# Patient Record
Sex: Male | Born: 1960
Health system: Southern US, Community
[De-identification: ages and names within clinical notes are randomized; demographics above are authoritative.]

## PROBLEM LIST (undated history)

## (undated) DIAGNOSIS — I471 Supraventricular tachycardia, unspecified: Secondary | ICD-10-CM

## (undated) DIAGNOSIS — I4892 Unspecified atrial flutter: Secondary | ICD-10-CM

## (undated) DIAGNOSIS — E669 Obesity, unspecified: Secondary | ICD-10-CM

## (undated) DIAGNOSIS — K219 Gastro-esophageal reflux disease without esophagitis: Secondary | ICD-10-CM

## (undated) HISTORY — PX: OTHER SURGICAL HISTORY: SHX169

## (undated) HISTORY — DX: Obesity, unspecified: E66.9

## (undated) HISTORY — DX: Unspecified atrial flutter: I48.92

## (undated) HISTORY — DX: Supraventricular tachycardia: I47.1

## (undated) HISTORY — DX: Gastro-esophageal reflux disease without esophagitis: K21.9

## (undated) HISTORY — DX: Supraventricular tachycardia, unspecified: I47.10

---

## 1999-06-28 ENCOUNTER — Emergency Department (HOSPITAL_COMMUNITY): Admission: EM | Admit: 1999-06-28 | Discharge: 1999-06-28 | Payer: Self-pay | Admitting: Emergency Medicine

## 2002-07-12 ENCOUNTER — Ambulatory Visit (HOSPITAL_COMMUNITY): Admission: RE | Admit: 2002-07-12 | Discharge: 2002-07-12 | Payer: Self-pay | Admitting: Sports Medicine

## 2002-07-12 ENCOUNTER — Encounter: Payer: Self-pay | Admitting: Sports Medicine

## 2002-07-13 ENCOUNTER — Ambulatory Visit (HOSPITAL_BASED_OUTPATIENT_CLINIC_OR_DEPARTMENT_OTHER): Admission: RE | Admit: 2002-07-13 | Discharge: 2002-07-13 | Payer: Self-pay | Admitting: *Deleted

## 2005-10-11 ENCOUNTER — Ambulatory Visit (HOSPITAL_COMMUNITY): Admission: RE | Admit: 2005-10-11 | Discharge: 2005-10-11 | Payer: Self-pay | Admitting: Orthopedic Surgery

## 2010-05-06 HISTORY — PX: OTHER SURGICAL HISTORY: SHX169

## 2011-01-16 ENCOUNTER — Inpatient Hospital Stay (HOSPITAL_COMMUNITY)
Admission: EM | Admit: 2011-01-16 | Discharge: 2011-01-17 | DRG: 121 | Disposition: A | Discharge status: Home / Self Care | Payer: BC Managed Care – PPO | Attending: Cardiology | Admitting: Cardiology

## 2011-01-16 ENCOUNTER — Emergency Department (HOSPITAL_COMMUNITY): Payer: BC Managed Care – PPO

## 2011-01-16 DIAGNOSIS — I4891 Unspecified atrial fibrillation: Secondary | ICD-10-CM | POA: Diagnosis present

## 2011-01-16 DIAGNOSIS — E669 Obesity, unspecified: Secondary | ICD-10-CM | POA: Diagnosis present

## 2011-01-16 DIAGNOSIS — I214 Non-ST elevation (NSTEMI) myocardial infarction: Secondary | ICD-10-CM | POA: Diagnosis present

## 2011-01-16 DIAGNOSIS — K219 Gastro-esophageal reflux disease without esophagitis: Secondary | ICD-10-CM | POA: Diagnosis present

## 2011-01-16 DIAGNOSIS — I498 Other specified cardiac arrhythmias: Principal | ICD-10-CM | POA: Diagnosis present

## 2011-01-16 DIAGNOSIS — I248 Other forms of acute ischemic heart disease: Secondary | ICD-10-CM | POA: Diagnosis present

## 2011-01-16 DIAGNOSIS — Z7982 Long term (current) use of aspirin: Secondary | ICD-10-CM

## 2011-01-16 DIAGNOSIS — E785 Hyperlipidemia, unspecified: Secondary | ICD-10-CM | POA: Diagnosis present

## 2011-01-16 DIAGNOSIS — I2489 Other forms of acute ischemic heart disease: Secondary | ICD-10-CM | POA: Diagnosis present

## 2011-01-16 DIAGNOSIS — R0602 Shortness of breath: Secondary | ICD-10-CM

## 2011-01-16 LAB — MAGNESIUM: Magnesium: 1.9 mg/dL (ref 1.5–2.5)

## 2011-01-16 LAB — BASIC METABOLIC PANEL
BUN: 31 mg/dL — ABNORMAL HIGH (ref 6–23)
CO2: 25 mEq/L (ref 19–32)
Calcium: 9 mg/dL (ref 8.4–10.5)
Chloride: 108 mEq/L (ref 96–112)
Creatinine, Ser: 1.19 mg/dL (ref 0.50–1.35)
GFR calc Af Amer: >60 mL/min (ref >60)
GFR calc non Af Amer: >60 mL/min (ref >60)
Glucose, Bld: 68 mg/dL — ABNORMAL LOW (ref 70–99)
Potassium: 3.7 mEq/L (ref 3.5–5.1)
Sodium: 143 mEq/L (ref 135–145)

## 2011-01-16 LAB — DIFFERENTIAL
Basophils Absolute: 0 10*3/uL (ref 0.0–0.1)
Basophils Relative: 0 % (ref 0–1)
Eosinophils Absolute: 0.1 10*3/uL (ref 0.0–0.7)
Eosinophils Relative: 1 % (ref 0–5)
Lymphocytes Relative: 35 % (ref 12–46)
Lymphs Abs: 3.2 10*3/uL (ref 0.7–4.0)
Monocytes Absolute: 0.8 10*3/uL (ref 0.1–1.0)
Monocytes Relative: 9 % (ref 3–12)
Neutro Abs: 4.9 10*3/uL (ref 1.7–7.7)
Neutrophils Relative %: 55 % (ref 43–77)

## 2011-01-16 LAB — CBC
HCT: 38.9 % — ABNORMAL LOW (ref 39.0–52.0)
Hemoglobin: 14.2 g/dL (ref 13.0–17.0)
MCH: 30.4 pg (ref 26.0–34.0)
MCHC: 36.5 g/dL — ABNORMAL HIGH (ref 30.0–36.0)
MCV: 83.3 fL (ref 78.0–100.0)
Platelets: 199 10*3/uL (ref 150–400)
RBC: 4.67 MIL/uL (ref 4.22–5.81)
RDW: 12 % (ref 11.5–15.5)
WBC: 9 10*3/uL (ref 4.0–10.5)

## 2011-01-16 LAB — POCT I-STAT TROPONIN I: Troponin i, poc: 0.14 ng/mL (ref 0.00–0.08)

## 2011-01-17 DIAGNOSIS — I471 Supraventricular tachycardia: Secondary | ICD-10-CM

## 2011-01-17 DIAGNOSIS — I517 Cardiomegaly: Secondary | ICD-10-CM

## 2011-01-17 LAB — COMPREHENSIVE METABOLIC PANEL
ALT: 33 U/L (ref 0–53)
AST: 24 U/L (ref 0–37)
Albumin: 3.8 g/dL (ref 3.5–5.2)
Alkaline Phosphatase: 81 U/L (ref 39–117)
BUN: 24 mg/dL — ABNORMAL HIGH (ref 6–23)
CO2: 25 mEq/L (ref 19–32)
Calcium: 8.7 mg/dL (ref 8.4–10.5)
Chloride: 107 mEq/L (ref 96–112)
Creatinine, Ser: 0.73 mg/dL (ref 0.50–1.35)
GFR calc Af Amer: >60 mL/min (ref >60)
GFR calc non Af Amer: >60 mL/min (ref >60)
Glucose, Bld: 90 mg/dL (ref 70–99)
Potassium: 3.6 mEq/L (ref 3.5–5.1)
Sodium: 140 mEq/L (ref 135–145)
Total Bilirubin: 0.4 mg/dL (ref 0.3–1.2)
Total Protein: 6.4 g/dL (ref 6.0–8.3)

## 2011-01-17 LAB — CK TOTAL AND CKMB (NOT AT ARMC)
Relative Index: 1.4 (ref 0.0–2.5)
Total CK: 518 U/L — ABNORMAL HIGH (ref 7–232)

## 2011-01-17 LAB — CBC
HCT: 39.1 % (ref 39.0–52.0)
Hemoglobin: 14 g/dL (ref 13.0–17.0)
MCH: 29.9 pg (ref 26.0–34.0)
MCHC: 35.8 g/dL (ref 30.0–36.0)
MCV: 83.4 fL (ref 78.0–100.0)
Platelets: 195 10*3/uL (ref 150–400)
RBC: 4.69 MIL/uL (ref 4.22–5.81)
RDW: 12.2 % (ref 11.5–15.5)
WBC: 7.4 10*3/uL (ref 4.0–10.5)

## 2011-01-17 LAB — GLUCOSE, CAPILLARY: Glucose-Capillary: 96 mg/dL (ref 70–99)

## 2011-01-17 LAB — HEMOGLOBIN A1C: Mean Plasma Glucose: 108 mg/dL (ref <117)

## 2011-01-17 LAB — LIPID PANEL
Cholesterol: 190 mg/dL (ref 0–200)
HDL: 47 mg/dL (ref >39)
LDL Cholesterol: 117 mg/dL — ABNORMAL HIGH (ref 0–99)
Total CHOL/HDL Ratio: 4 RATIO
Triglycerides: 130 mg/dL (ref <150)
VLDL: 26 mg/dL (ref 0–40)

## 2011-01-17 LAB — CK: Total CK: 524 U/L — ABNORMAL HIGH (ref 7–232)

## 2011-01-17 LAB — PROTIME-INR
INR: 0.94 (ref 0.00–1.49)
Prothrombin Time: 12.8 seconds (ref 11.6–15.2)

## 2011-01-17 LAB — TROPONIN I: Troponin I: 0.52 ng/mL (ref <0.30)

## 2011-01-17 LAB — HEPARIN LEVEL (UNFRACTIONATED): Heparin Unfractionated: 0.28 IU/mL — ABNORMAL LOW (ref 0.30–0.70)

## 2011-01-19 NOTE — H&P (Signed)
NAMEDEVAL, MROCZKA NO.:  [id number]  MEDICAL RECORD NO.:  [id number]  LOCATION:  [facility]                         FACILITY:  [facility]  PHYSICIAN:  Lenon Oms, MD  DATE OF BIRTH:  Dec 31, 1960  DATE OF ADMISSION:  01/16/2011 DATE OF DISCHARGE:                             HISTORY & PHYSICAL   CARDIOLOGIST:  None.  PRIMARY CARE PHYSICIAN:  Doran Nestle. Ehinger, MD.  CHIEF COMPLAINT:  Shortness of breath.  HISTORY OF PRESENT ILLNESS:  [name] is a 50 year old Huynh with no history for MI or known coronary disease who presented to the emergency department here at [facility] with chief complaint of shortness of breath.  The patient stated he was at work this afternoon where he had sudden onset of shortness of breath and around 4:30 p.m., the patient states that his heart was racing along with dizziness, diaphoresis, and chest heaviness.  He rated his chest heaviness as 5/10 at that time.  The patient's symptoms did not go away with rest, so the patient went to see a friend who works at the [employer].  There, he was noted to be tachycardic and so EMS was called.  The patient was attempted to break his tachycardia with adenosine prior to arrival here at the emergency department.  He was given a total of 24 mg of adenosine, however, had no response.  In addition, had no response to vagal maneuvers.  The patient in emergency room here remained tachycardic and what appeared to be SVT.  Heart rate is around 150s-200s.  The patient was again given another dose of adenosine 6 mg with no response.  Given his symptomatic tachycardia, the patient was cardioverted at 100 joules.  The patient did have successful response and was in normal sinus rhythm status post cardioversion. At this time, the patient stated his symptoms have improved and is resting comfortably.  PAST MEDICAL HISTORY:  The patient denied any history of arrhythmia. The patient is not  currently treated for hypertension, diabetes, high cholesterol.  Denies any prior cardiac catheterization, stress test.  ALLERGIES:  No known drug allergies.  MEDICATIONS:  The patient takes occasional ibuprofen and aspirin.  PAST SURGICAL HISTORY:  The patient had right toe surgery 2weeks prior.  SOCIAL HISTORY:  The patient lives in [place] with his girlfriend and her son.  Occupation, the patient works in Therapist, music care.  He denies any tobacco use.  He drinks approximately 1-2 beers a day.  FAMILY HISTORY:  Mother had a brain aneurysm.  Father has congestive heart failure and diabetes.  REVIEW OF SYSTEMS:  The patient has had GERD in the past, which he is taking Prilosec; however, this has been over a year.  Denied any recent similar episodes or lower chest pain.  All other systems are reviewed and otherwise negative.  PHYSICAL EXAMINATION:  VITAL SIGNS: Temperature 97.3, pulse 155, respiratory rate 17, blood pressure 122/89. GENERAL:  He is an obese male in no acute distress. HEENT:  Normocephalic, atraumatic.  Pupils equally round and reactive to light.  Extraocular movements intact.  Anicteric sclera. NECK:  Supple.  No JVD. LUNGS:  Clear to auscultation bilaterally. HEART:  Regular rate and  rhythm.  Normal S1, S2. ABDOMEN:  Normoactive bowel sounds.  Soft, nontender, nondistended. EXTREMITIES:  No cyanosis, clubbing, or edema. NEUROLOGICAL:  Awake, alert and oriented x3.  RADIOLOGY: 1. The patient had a chest x-ray which revealed no acute abnormality. 2. The patient had several EKGs, initial telemetry strips are     revealing what appears to be supraventricular tachycardia.  Heart     rate into the 200s.  The patient also had a repeat EKG which     appeared to be atrial fibrillation with RVR.  Heart rate is in the     140s.  After cardioversion, repeat EKG showed sinus tachycardia.     Heart rate of 104 with nonspecific ST-T changes. 3. There are no prior EKGs for  comparison.  LABORATORY DATA:  The patient had a CBC profile which revealed a white count of 9.0, hemoglobin of 14.2, hematocrit of 38.9, platelets of 199. The patient had a basic metabolic profile which revealed a sodium of 143, potassium of 3.9, BUN of 31, creatinine of 1.9.  Magnesium was 1.9. INR was 0.94.  Troponin initial was 0.14.  Impression 1.  Supraventricular tachycardia 2.  Atrial fibrillation with rapid ventricular response.    Plan: The patient is now in sinus rhythm status post direct current cardioversion.  The patient will be admitted to the step-down unit through [facility].  He will be placed on continous telemetry for any repeat events.  At this time, we will monitor  serial biomarkers and will obtain transthoracic echocardiogram in a.m. to evaluate LV function.  At this time, medical management will include aspirin 325, heparin drip and also will start low-dose beta-blocker.  We will risk stratify and check hemoglobin A1c, lipid profile, and TSH.  We will keep n.p.o. at midnight time.  We will reassess in a.m. and consider further evaluation as to whether or not there is underlying ischemia.  Depending on clinical course may need further EP evaluation.          ______________________________ Lenon Oms, MD     PB/MEDQ  D:  01/17/2011  T:  01/17/2011  Job:  [id number]  Electronically Signed by Lenon Oms MD on 01/19/2011 05:59:12 PM

## 2011-01-23 ENCOUNTER — Encounter: Payer: Self-pay | Admitting: *Deleted

## 2011-02-04 ENCOUNTER — Other Ambulatory Visit (HOSPITAL_COMMUNITY): Payer: Self-pay | Admitting: Internal Medicine

## 2011-02-04 ENCOUNTER — Encounter: Payer: Self-pay | Admitting: Internal Medicine

## 2011-02-04 DIAGNOSIS — R079 Chest pain, unspecified: Secondary | ICD-10-CM

## 2011-02-05 ENCOUNTER — Ambulatory Visit (INDEPENDENT_AMBULATORY_CARE_PROVIDER_SITE_OTHER): Payer: BC Managed Care – PPO | Admitting: Internal Medicine

## 2011-02-05 ENCOUNTER — Encounter: Payer: Self-pay | Admitting: Internal Medicine

## 2011-02-05 ENCOUNTER — Ambulatory Visit (HOSPITAL_COMMUNITY): Payer: BC Managed Care – PPO | Attending: Internal Medicine | Admitting: Radiology

## 2011-02-05 VITALS — Ht 74.0 in | Wt 250.0 lb

## 2011-02-05 DIAGNOSIS — R079 Chest pain, unspecified: Secondary | ICD-10-CM | POA: Insufficient documentation

## 2011-02-05 DIAGNOSIS — I10 Essential (primary) hypertension: Secondary | ICD-10-CM

## 2011-02-05 DIAGNOSIS — I471 Supraventricular tachycardia: Secondary | ICD-10-CM

## 2011-02-05 DIAGNOSIS — R0989 Other specified symptoms and signs involving the circulatory and respiratory systems: Secondary | ICD-10-CM

## 2011-02-05 DIAGNOSIS — I4949 Other premature depolarization: Secondary | ICD-10-CM

## 2011-02-05 MED ORDER — TECHNETIUM TC 99M TETROFOSMIN IV KIT
33.0000 | PACK | Freq: Once | INTRAVENOUS | Status: AC | PRN
  Administered 2011-02-05: 33 via INTRAVENOUS

## 2011-02-05 MED ORDER — TECHNETIUM TC 99M TETROFOSMIN IV KIT
11.0000 | PACK | Freq: Once | INTRAVENOUS | Status: AC | PRN
  Administered 2011-02-05: 11 via INTRAVENOUS

## 2011-02-05 NOTE — Patient Instructions (Signed)
Your physician wants you to follow-up in: 24 months with Dr. Taylor You will receive a reminder letter in the mail two months in advance. If you don't receive a letter, please call our office to schedule the follow-up appointment.  

## 2011-02-05 NOTE — Progress Notes (Signed)
[facility] SITE 3 NUCLEAR MED [address] [place] [address] [phone]  Cardiology Nuclear Med Study  [name] is a 50 y.o. male [id number] 1960/11/12   Nuclear Med Background Indication for Stress Test:  Evaluation for Ischemia and 01/17/11 [facility] with SVT, Chest pain and SOB. Cardioversion performed to NSR History:  01/17/11 Echo: EF=60-65%, mild LVH; 01/17/11 PSVT converted with Electrical Cardioversion, Asthma as a child Cardiac Risk Factors: Family History - CAD  Symptoms:  Chest Pain (last date of chest discomfort 01/17/11) but a mild soreness is chest continues since discharge, Diaphoresis, Dizziness, DOE, Light-Headedness, Rapid HR and SOB   Nuclear Pre-Procedure Caffeine/Decaff Intake:  None NPO After: 8:00pm   Lungs:  Clear IV 0.9% NS with Angio Cath:  20g  IV Site: R Wrist  IV Started by:  Cathlyn Parsons, RN  Chest Size (in):  48 Cup Size: n/a  Height: 6\' 2"  (1.88 m)  Weight:  250 lb (113.399 kg)  BMI:  Body mass index is 32.10 kg/(m^2). Tech Comments:  Toprol held x 14 hrs    Nuclear Med Study 1 or 2 day study: 1 day  Stress Test Type:  Stress  Reading MD: Willa Rough, MD  Order Authorizing Provider:  Lewayne Bunting, MD  Resting Radionuclide: Technetium 31m Tetrofosmin  Resting Radionuclide Dose: 10.5 mCi   Stress Radionuclide:  Technetium 60m Tetrofosmin  Stress Radionuclide Dose: 33 mCi           Stress Protocol Rest HR: 61 Stress HR: 164  Rest BP: 109/69 Stress BP: 191/43  Exercise Time (min): 10:30 METS: 12.5   Predicted Max HR: 171 bpm % Max HR: 95.91 bpm Rate Pressure Product: 78469   Dose of Adenosine (mg):  n/a Dose of Lexiscan: n/a mg  Dose of Atropine (mg): n/a Dose of Dobutamine: n/a mcg/kg/min (at max HR)  Stress Test Technologist: Irean Hong, RN  Nuclear Technologist:  Domenic Polite, CNMT     Rest Procedure:  Myocardial perfusion imaging was performed at rest 45 minutes following the intravenous  administration of Technetium 54m Tetrofosmin. Rest ECG: Sinus Bradycardia  Stress Procedure:  The patient exercised for 10 minutes and 30 seconds, RPE=17 per patient.  The patient stopped due to DOE and denied any chest pain.  There were nonspecific ST-T wave changes. There was a rare PVC.  Technetium 28m Tetrofosmin was injected at peak exercise and myocardial perfusion imaging was performed after a brief delay. Stress ECG: No significant ST segment change suggestive of ischemia.  QPS Raw Data Images:  Patient motion noted; appropriate software correction applied. Stress Images:  Normal homogeneous uptake in all areas of the myocardium. Rest Images:  Normal homogeneous uptake in all areas of the myocardium. Subtraction (SDS):  No evidence of ischemia. Transient Ischemic Dilatation (Normal <1.22):  .82 Lung/Heart Ratio (Normal <0.45):  .27  Quantitative Gated Spect Images QGS EDV:  134 ml QGS ESV:  65 ml QGS cine images:  Normal Wall Motion QGS EF: 52%  Impression Exercise Capacity:  Good exercise capacity. BP Response:  Normal blood pressure response. Clinical Symptoms:  No chest pain. ECG Impression:  No significant ST segment change suggestive of ischemia. Comparison with Prior Nuclear Study: No previous nuclear study performed  Overall Impression:  Normal stress nuclear study.  Willa Rough

## 2011-02-05 NOTE — Progress Notes (Signed)
HPI [name] returns today for followup. He is a pleasant 50 year old man with a history of SVT who ruled in for non-ST elevation MI several weeks ago in the setting of SVT at 240 beats per minute. Since then he has done well. He does have borderline hypertension. Since discharge from the hospital he has had no symptomatic SVT. He denies chest pain or shortness of breath. No Known Allergies   Current Outpatient Prescriptions  Medication Sig Dispense Refill  . metoprolol tartrate (LOPRESSOR) 25 MG tablet Take 25 mg by mouth daily.         No current facility-administered medications for this visit.   Facility-Administered Medications Ordered in Other Visits  Medication Dose Route Frequency Provider Last Rate Last Dose  . technetium tetrofosmin (TC-MYOVIEW) injection 11 milli Curie  11 milli Curie Intravenous Once PRN Luis Abed, MD   11 milli Curie at 02/05/11 1025  . technetium tetrofosmin (TC-MYOVIEW) injection 33 milli Curie  33 milli Curie Intravenous Once PRN Luis Abed, MD   33 milli Curie at 02/05/11 1230     Past Medical History  Diagnosis Date  . Hypertension   . Diabetes mellitus   . High cholesterol     ROS:   All systems reviewed and negative except as noted in the HPI.   Past Surgical History  Procedure Date  . Right toe surgery      Family History  Problem Relation Age of Onset  . Aneurysm      Brain  . Heart failure      congestive  . Diabetes       History   Social History  . Marital Status: Divorced    Spouse Name: N/A    Number of Children: N/A  . Years of Education: N/A   Occupational History  . lawn care    Social History Main Topics  . Smoking status: Never Smoker   . Smokeless tobacco: Not on file  . Alcohol Use: Yes  . Drug Use: No  . Sexually Active: Not on file   Other Topics Concern  . Not on file   Social History Narrative  . No narrative on file     BP 111/74  Pulse 72  Ht 6\' 2"  (1.88 m)  Wt 250 lb  (113.399 kg)  BMI 32.10 kg/m2  Physical Exam:  Well appearing LH man, NAD HEENT: Unremarkable Neck:  No JVD, no thyromegally Lymphatics:  No adenopathy Back:  No CVA tenderness Lungs:  Clear no wheezes, rales, or rhonchi. HEART:  Regular rate rhythm, no murmurs, no rubs, no clicks Abd:  soft, positive bowel sounds, no organomegally, no rebound, no guarding Ext:  2 plus pulses, no edema, no cyanosis, no clubbing Skin:  No rashes no nodules Neuro:  CN II through XII intact, motor grossly intact  EKG Normal sinus rhythm. No ventricular preexcitation.   Assess/Plan:

## 2011-02-05 NOTE — Assessment & Plan Note (Signed)
The patient's blood pressure on medical therapy has been well controlled. We discussed the possibility of stopping his medical therapy but at this time I think continuing beta blocker therapy which helped control his blood pressure and his heart racing is warranted. He'll maintain a low-sodium diet. In addition I've counseled him on avoiding more than one or 2 alcoholic beverages per day.

## 2011-02-05 NOTE — Assessment & Plan Note (Signed)
His symptoms are currently well controlled on low-dose beta blocker. I have recommended that he continue this medication. As he has had only one episode of SVT, and because he has not been on medical therapy until now, I would recommend a period of watchful waiting rather than proceeding with catheter ablation. If he has recurrent SVT on medical therapy, or if he is unable to tolerate medical therapy and has recurrent SVT, and catheter ablation would be appropriate.

## 2011-02-14 NOTE — Discharge Summary (Signed)
  [name], [name] NO.:  [id number]  MEDICAL RECORD NO.:  [id number]  LOCATION:  2922                         FACILITY:  [facility]  PHYSICIAN:  Steven Canning. Ladona Ridgel, MD    DATE OF BIRTH:  07/30/1960  DATE OF ADMISSION:  01/16/2011 DATE OF DISCHARGE:  01/17/2011                              DISCHARGE SUMMARY   PROCEDURES: 1. An 2-D echocardiogram. 2. Two-view chest x-ray.  PRIMARY FINAL DISCHARGE DIAGNOSIS:  Symptomatic supraventricular tachycardia.  SECONDARY DIAGNOSES: 1. Elevated cardiac enzymes consistent with non-ST segment elevation     myocardial infarction, felt type 2 secondary to supplied demand     mismatch. 2. Dyslipidemia with an HDL of 47, LDL of 117. 3. Obesity. 4. Status post foot surgery and left mandible repair after trauma. 5. Family history of congestive heart failure in his father. 6. History of reflux symptoms.  TIME AT DISCHARGE:  32 minutes.  HOSPITAL COURSE:  [name] is a 50 year old male with no previous cardiac issues.  He had heart racing and shortness of breath.  He also had chest heaviness with this.  He was found to be tachycardic with heart rates up to 240 by EMS, but adenosine was not successful, nor were vagal maneuvers.  He had a repeat dose of adenosine in the emergency room, but it did not convert him.  Because he was symptomatic, he was cardioverted at 100 joules into normal sinus rhythm.  Once he was in sinus rhythm, his chest pain resolved as did his shortness of breath. He was admitted for further evaluation.  His cardiac enzymes had some elevation with a peak CK-MB of 518/7.1 and a peak troponin of 0.52.  A TSH was within normal limits at 2.9.  He had no significant abnormalities and other labs except for an initial blood glucose of 68.  Overnight, he maintained sinus rhythm and the next day EP consulted.  Dr. Ladona Huynh felt that he should be started on a beta- blocker, but no further inpatient workup was  indicated as long as his EF was normal by echo.  His echocardiogram showed an EF of 60-65% with no regional wall motion abnormalities, no significant valvular abnormalities and he did not have diastolic dysfunction.  The chest x- ray showed no acute disease.  Dr. Ladona Huynh considered [name] stable for discharge in improved condition, to follow up as an outpatient with a stress test in an office visit.  DISCHARGE INSTRUCTIONS:  His activity level to be increased gradually. He is encouraged to stick to a low-sodium heart-healthy diet.  He is to follow up at [facility] on February 05, 2011, for a stress test at 9:30 and to see Dr. Ladona Huynh at 12:30.  DISCHARGE MEDICATIONS: 1. Aspirin 81 mg daily. 2. Ibuprofen 3-4 tablets q.6 h. p.r.n. 3. Toprol-XL 25 mg daily, hold a.m. a stress test.     Steven Demark, PA-C   ______________________________ Steven Canning. Ladona Ridgel, MD    RB/MEDQ  D:  01/17/2011  T:  01/17/2011  Job:  [id number]  Electronically Signed by Steven Demark PA-C on 02/11/2011 06:34:46 AM Electronically Signed by Lewayne Bunting MD on 02/14/2011 06:46:36 PM

## 2011-02-14 NOTE — Consult Note (Signed)
NAMESRIHAAN, MASTRANGELO NO.:  [id number]  MEDICAL RECORD NO.:  [id number]  LOCATION:  2922                         FACILITY:  [facility]  PHYSICIAN:  Doylene Canning. Ladona Ridgel, MD    DATE OF BIRTH:  December 01, 1960  DATE OF CONSULTATION:  01/17/2011 DATE OF DISCHARGE:  01/17/2011                                CONSULTATION   CONSULTATION REQUESTED:  Dr. Sherlie Ban.  INDICATION FOR CONSULTATION:  Evaluation of incessant SVT.  HISTORY OF PRESENT ILLNESS:  The patient is a 50 year old man who has been otherwise healthy.  He has a history of obesity.  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  He went to the fire station where he was found to be in SVT at over 200 beats per minute.  Multiple rounds of adenosine were given without success followed by DC cardioversion restoring sinus rhythm.  He had no recurrent episodes.  The patient is admitted for evaluation.  His serial initial cardiac enzymes were elevated slightly.  A 2-D echo was obtained, pending.  The patient denies alcohol or caffeine abuse.  He denies illicit drug use.  He has not had SVT before.  PAST MEDICAL HISTORY:  Unremarkable.  PAST SURGICAL HISTORY:  Right foot surgery.  CURRENT MEDICATIONS:  Aspirin is only medication along with occasional ibuprofen.  SOCIAL HISTORY:  The patient is not married.  He lives with his girlfriend.  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  He denies tobacco or ethanol use.  He drinks 1 to 2 beers per day by his report.  FAMILY HISTORY:  Notable for mother died of brain aneurysm and father with congestive heart failure.  REVIEW OF SYSTEMS:  All systems reviewed, negative except as noted in the HPI.  PHYSICAL EXAMINATION:  GENERAL:  He is a pleasant, well-appearing, middle-aged man in no distress. VITAL SIGNS:  Blood pressure was 108/70, pulse was 70 and regular, respirations 18, temperature 98. HEENT:   Normocephalic, atraumatic.  Pupils are equal and round. Oropharynx is moist.  Sclerae anicteric. NECK:  No jugular venous distention.  There was no thyromegaly.  Trachea was midline. Carotids 2+ and symmetrical. LUNGS:  Clear bilateral to auscultation.  No wheezes, rales, or rhonchi are present. CARDIAC:  Regular rate and rhythm.  Normal S1 and S2.  No murmurs, rubs, or gallops.  PMI was not enlarged, laterally displaced. ABDOMEN:  Obese, nontender, nondistended.  There was no organomegaly. Bowel sounds were present.  There was no rebound or guarding. EXTREMITIES:  No cyanosis, clubbing or edema.  Pulses 2+ and symmetric. NEUROLOGIC:  Alert and oriented x3.  Cranial nerves intact.  Strength is 5/5 and symmetric.  EKG demonstrates sinus rhythm with no ventricular pre-excitation. Review of the 12-lead EKG, during SVT, demonstrated narrow QRS tachycardia.  The relationship of the R in the P interval was unclear. It looks like it is a short RP tachycardia.  There is no obvious QRS alternans.  IMPRESSION: 1. Initial episode of sustained  fairly incessant supraventricular     tachycardia failing adenosine intravenously. 2. Obesity.  I have discussed treatment options with the patient.  I  recommended that he start a beta-blocker.  If he has recurrent     supraventricular tachycardia by beta-blockers, then catheter     ablation would be a consideration.     Doylene Canning. Ladona Ridgel, MD     GWT/MEDQ  D:  01/17/2011  T:  01/17/2011  Job:  [id number]  cc:   Bryan Lemma. Manus Gunning, M.D.  Electronically Signed by Lewayne Bunting MD on 02/14/2011 06:46:31 PM

## 2011-03-05 ENCOUNTER — Ambulatory Visit (INDEPENDENT_AMBULATORY_CARE_PROVIDER_SITE_OTHER): Payer: BC Managed Care – PPO | Admitting: Physician Assistant

## 2011-03-05 ENCOUNTER — Encounter: Payer: Self-pay | Admitting: Physician Assistant

## 2011-03-05 VITALS — BP 116/68 | HR 67 | Ht 74.0 in | Wt 250.0 lb

## 2011-03-05 DIAGNOSIS — I471 Supraventricular tachycardia: Secondary | ICD-10-CM

## 2011-03-05 DIAGNOSIS — R0602 Shortness of breath: Secondary | ICD-10-CM

## 2011-03-05 DIAGNOSIS — R079 Chest pain, unspecified: Secondary | ICD-10-CM | POA: Insufficient documentation

## 2011-03-05 MED ORDER — METOPROLOL SUCCINATE ER 25 MG PO TB24: 12.5000 mg | ORAL_TABLET | Freq: Two times a day (BID) | ORAL | Status: DC

## 2011-03-05 NOTE — Patient Instructions (Signed)
Your physician recommends that you schedule a follow-up appointment in: 04/05/11 @ 10:30 to see Tereso Newcomer, PA-C  Your physician has recommended you make the following change in your medication: DECREASE TOPROL XL 12.5 MG TWICE DAILY

## 2011-03-05 NOTE — Assessment & Plan Note (Signed)
He was not taking any medications prior to going to the hospital last month.  His EF was normal on EKG.  I suspect he may be feeling some side effects (fatigue) from the Toprol.  Change dosing to Toprol XL 25 mg 1/2 BID to see if this helps.  Follow up with me in one month.

## 2011-03-05 NOTE — Assessment & Plan Note (Signed)
Atypical.  He walked over 10 minutes on his ETT-myoview and had no EKG changes and his images were normal.  His symptoms are not really consistent with angina.  EKG is normal.  I suspect his symptoms are related to GERD.  Start Prilosec OTC QD.  We discussed taking this for 2-4 weeks and the importance of tapering off to avoid rebound reflux symptoms.  Follow up with me in 1 month.

## 2011-03-05 NOTE — Assessment & Plan Note (Signed)
No apparent recurrence.  Follow up with Dr. Ladona Ridgel as planned.

## 2011-03-05 NOTE — Progress Notes (Signed)
History of Present Illness: Primary Electrophysiologist:  Dr. Lewayne Bunting   [name] is a 50 y.o. male presents for evaluation of chest pain.  He was admx to [facility] 01/[facility] for PVST.  He failed adenosine x 2 and was eventually cardioverted.  HR was 240.  His troponin was mildly elevated ruling him in for a NSTEMI felt to be from demand ischemia.  Echo 9/12: mild LVH, EF 60-65%.  ETT-myoview 10/12: no ischemia, he exercised for over 10 minutes without chest pain.  He saw Dr. Ladona Ridgel recently was doing well on medical therapy and a period of watchful waiting was employed in lieu of proceeding directly to [facility]CA of SVT.    He notes chest heaviness that is constant since his hospitalization.  It sometimes worsens with meals.  He has a h/o GERD.  He denies exertional chest pain.  He does note feeling more breathless with activities.  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  It is not severe.  He denies orthopnea, PND or edema.  No palpitations.  No syncope.    Past Medical History  Diagnosis Date  . PSVT (paroxysmal supraventricular tachycardia)   . GERD (gastroesophageal reflux disease)     Current Outpatient Prescriptions  Medication Sig Dispense Refill  . metoprolol succinate (TOPROL XL) 25 MG 24 hr tablet Take 0.5 tablets (12.5 mg total) by mouth 2 (two) times daily.  60 tablet  11    Allergies: No Known Allergies  History  Substance Use Topics  . Smoking status: Never Smoker   . Smokeless tobacco: Not on file  . Alcohol Use: Yes     ROS:  Please see the history of present illness.  No melena, hematochezia, hematemesis, odynophagia, dysphagia or weight loss.  All other systems reviewed and negative.   Vital Signs: BP 116/68  Pulse 67  Ht 6\' 2"  (1.88 m)  Wt 250 lb (113.399 kg)  BMI 32.10 kg/m2  PHYSICAL EXAM: Well nourished, well developed, in no acute distress HEENT: normal Neck: no JVD Cardiac:  normal S1, S2; RRR; no  murmur, no rubs Chest: no pain with palpation Lungs:  clear to auscultation bilaterally, no wheezing, rhonchi or rales Abd: soft, nontender, no hepatomegaly Ext: no edema Skin: warm and dry Neuro:  CNs 2-12 intact, no focal abnormalities noted Psych: normal affect  EKG:  NSR, HR 67, no ischemic changes  ASSESSMENT AND PLAN:

## 2011-04-05 ENCOUNTER — Ambulatory Visit: Payer: BC Managed Care – PPO | Admitting: Physician Assistant

## 2012-01-29 ENCOUNTER — Other Ambulatory Visit: Payer: Self-pay | Admitting: *Deleted

## 2012-01-29 MED ORDER — METOPROLOL SUCCINATE ER 25 MG PO TB24: 12.5000 mg | ORAL_TABLET | Freq: Two times a day (BID) | ORAL | Status: DC

## 2012-03-18 ENCOUNTER — Ambulatory Visit (INDEPENDENT_AMBULATORY_CARE_PROVIDER_SITE_OTHER): Payer: BC Managed Care – PPO | Admitting: Physician Assistant

## 2012-03-18 ENCOUNTER — Encounter: Payer: Self-pay | Admitting: Physician Assistant

## 2012-03-18 VITALS — BP 110/66 | HR 67 | Ht 74.0 in | Wt 253.0 lb

## 2012-03-18 DIAGNOSIS — I471 Supraventricular tachycardia: Secondary | ICD-10-CM

## 2012-03-18 DIAGNOSIS — I10 Essential (primary) hypertension: Secondary | ICD-10-CM

## 2012-03-18 MED ORDER — METOPROLOL SUCCINATE ER 25 MG PO TB24: 25.0000 mg | ORAL_TABLET | Freq: Every day | ORAL | Status: DC

## 2012-03-18 MED ORDER — METOPROLOL SUCCINATE ER 50 MG PO TB24: 25.0000 mg | ORAL_TABLET | Freq: Every day | ORAL | Status: DC

## 2012-03-18 NOTE — Patient Instructions (Addendum)
**Note De-identified [place] Obfuscation** Your physician recommends that you continue on your current medications as directed. Please refer to the Current Medication list given to you today.  Your physician wants you to follow-up in: 1 year. You will receive a reminder letter in the mail two months in advance. If you don't receive a letter, please call our office to schedule the follow-up appointment.  

## 2012-03-18 NOTE — Assessment & Plan Note (Addendum)
Patient had one episode of PSVT as described above. He's had no recurrence. He is doing well on metoprolol. We will continue this and have him follow up in one year with Dr. Ladona Ridgel.He has atypical symptoms in his chest that are hard for him to describe but are not bothersome. No further workup.

## 2012-03-18 NOTE — Assessment & Plan Note (Signed)
Stable

## 2012-03-18 NOTE — Progress Notes (Signed)
HPI:  Primary Electrophysiologist:  Dr. Lewayne Bunting   [name] is a 51 y.o. male presents for yearly follow up.  He was admx to [facility] in 01/2011 for PVST.  He failed adenosine x 2 and was eventually cardioverted.  HR was 240.  His troponin was mildly elevated ruling him in for a NSTEMI felt to be from demand ischemia.  Echo 9/12: mild LVH, EF 60-65%.  ETT-myoview 10/12: no ischemia, he exercised for over 10 minutes without chest pain.  He saw Dr. Ladona Ridgel and was doing well on medical therapy and a period of watchful waiting was employed in lieu of proceeding directly to [facility] of SVT.    Today he says he has twinges in his chest that move around and occur daily. He has a hard time describing what it feels like. He says it's not palpitations, tightness, sharp shooting, squeezing or painful. He says he just feels like something is moving around in his left chest ever since his cardioversion. Occasionally he'll feel a fluttering in his chest when he is laying down at night but it does not last and is not bothersome. It may occur twice a month.   No Known Allergies  Current Outpatient Prescriptions on File Prior to Visit: metoprolol succinate (TOPROL XL) 25 MG 24 hr tablet, Take 0.5 tablets (12.5 mg total) by mouth 2 (two) times daily., Disp: 30 tablet, Rfl: 2    Past Medical History:   PSVT (paroxysmal supraventricular tachycardia)               GERD (gastroesophageal reflux disease)                      Past Surgical History:   right toe surgery                                           Review of patient's family history indicates:   Aneurysm                                                  Comment: Brain   Heart failure                                             Comment: congestive   Diabetes                                                Social History   Marital Status: Divorced            Spouse Name:                      Years of Education:                 Number of children:              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 Neck: No JVD, HJR, Bruit, or thyroid enlargement  Lungs: No tachypnea, clear without wheezing, rales, or rhonchi  Cardiovascular: RRR, PMI not displaced, heart sounds normal, no murmurs, gallops, bruit, thrill, or heave.  Abdomen: BS normal. Soft without organomegaly, masses, lesions or tenderness.  Extremities: without cyanosis, clubbing or edema. Good distal pulses bilateral  SKin: Warm, no lesions or rashes   Musculoskeletal: No deformities  Neuro: no focal signs  There were no vitals taken for this visit.   ZOX:WRUEAV sinus rhythm, normal EKG

## 2013-05-13 ENCOUNTER — Ambulatory Visit (INDEPENDENT_AMBULATORY_CARE_PROVIDER_SITE_OTHER): Payer: BC Managed Care – PPO | Admitting: Internal Medicine

## 2013-05-13 ENCOUNTER — Encounter: Payer: Self-pay | Admitting: Internal Medicine

## 2013-05-13 VITALS — BP 129/78 | HR 70 | Ht 74.0 in | Wt 256.8 lb

## 2013-05-13 DIAGNOSIS — N529 Male erectile dysfunction, unspecified: Secondary | ICD-10-CM

## 2013-05-13 DIAGNOSIS — R0602 Shortness of breath: Secondary | ICD-10-CM

## 2013-05-13 DIAGNOSIS — I471 Supraventricular tachycardia: Secondary | ICD-10-CM

## 2013-05-13 MED ORDER — METOPROLOL SUCCINATE ER 50 MG PO TB24: ORAL_TABLET | ORAL | Status: DC

## 2013-05-13 NOTE — Assessment & Plan Note (Signed)
His shortness of breath and chest pressure or new problem. They're very mild however. He does admit to being fairly sedentary. I've recommended the patient undergo exercise stress testing for evaluation. I would prefer that he undergo treadmill stress testing but it is unclear as to whether or not he can walk on the treadmill because of plantar fasciitis. I've asked the patient to speak to his physician who is caring for his foot pain and plantar fasciitis. If his physician thinks that he can walk on the treadmill in the next few weeks, that he would do so. If not, we will schedule  Lexiscan Myoview.

## 2013-05-13 NOTE — Patient Instructions (Signed)
Please check with your doctor about plantar fascitis - whether you can walk on a treadmill or if it need to be chemical stress test. Call and speak with Janan Halter, RN to inform her of decision.  Your physician wants you to follow-up in: 6 months with Dr. Lovena Le.  You will receive a reminder letter in the mail two months in advance. If you don't receive a letter, please call our office to schedule the follow-up appointment.

## 2013-05-13 NOTE — Assessment & Plan Note (Signed)
His supraventricular arrhythmias have been stable. For now, he will continue his beta blocker.

## 2013-05-13 NOTE — Assessment & Plan Note (Signed)
This appears to be a new problem. I would consider initiation of Viagra, but I'm concerned about his shortness of breath, and they recommended stress testing prior to prescription of this medication. In addition, we would consider stopping metoprolol. I would prefer not to stop metoprolol because it has controlled his very rapid supraventricular arrhythmias for the last 2 years.

## 2013-05-13 NOTE — Progress Notes (Signed)
      HPI [name] returns today  For followup of SVT. He is a very pleasant middle-age man who I saw initially 2 years ago. He presented to the emergency room with supraventricular tachycardia at a rate of 240 beats per minute, for which he was treated with intravenous adenosine, which failed to terminate his arrhythmia. He was cardioverted. He was placed on a beta blocker. He's had minimal palpitations in the last 2 years on his beta blocker. He has been bothered by worsening erectile dysfunction. He wonders about taking medication for this. The patient notes shortness of breath with exertion, and his exercise capacity has worsened in the last several months. He has vague substernal chest discomfort, which is not clearly related to exertion. There is no radiation. He has not had syncope. In addition, he has been bothered by the foot and heel pain and has been diagnosed with plantar fasciitis. While he is able to skate, he has not been able to walk in the last few weeks. No Known Allergies   Current Outpatient Prescriptions  Medication Sig Dispense Refill  . metoprolol succinate (TOPROL-XL) 50 MG 24 hr tablet Take as directed  30 tablet  6   No current facility-administered medications for this visit.     Past Medical History  Diagnosis Date  . PSVT (paroxysmal supraventricular tachycardia)   . GERD (gastroesophageal reflux disease)     ROS:   All systems reviewed and negative except as noted in the HPI.   Past Surgical History  Procedure Laterality Date  . Right toe surgery       Family History  Problem Relation Age of Onset  . Aneurysm      Brain  . Heart failure      congestive  . Diabetes       History   Social History  . Marital Status: Divorced    Spouse Name: N/A    Number of Children: N/A  . Years of Education: N/A   Occupational History  . lawn care    Social History Main Topics  . Smoking status: Never Smoker   . Smokeless tobacco: Not on file    . Alcohol Use: Yes  . Drug Use: No  . Sexual Activity: Not on file   Other Topics Concern  . Not on file   Social History Narrative  . No narrative on file     BP 129/78  Pulse 70  Ht 6\' 2"  (1.88 m)  Wt 256 lb 12.8 oz (116.484 kg)  BMI 32.96 kg/m2  Physical Exam:  Well appearing 53 year old man, NAD HEENT: Unremarkable Neck:  No JVD, no thyromegally Back:  No CVA tenderness Lungs:  Clear with no wheezes, rales, or rhonchi. HEART:  Regular rate rhythm, no murmurs, no rubs, no clicks Abd:  soft, positive bowel sounds, no organomegally, no rebound, no guarding Ext:  2 plus pulses, no edema, no cyanosis, no clubbing Skin:  No rashes no nodules Neuro:  CN II through XII intact, motor grossly intact  EKG - normal sinus rhythm with normal axis and intervals. There is no ventricular preexcitation.   Assess/Plan:

## 2014-05-23 ENCOUNTER — Other Ambulatory Visit: Payer: Self-pay | Admitting: Internal Medicine

## 2014-08-30 ENCOUNTER — Other Ambulatory Visit: Payer: Self-pay

## 2014-08-30 ENCOUNTER — Ambulatory Visit (INDEPENDENT_AMBULATORY_CARE_PROVIDER_SITE_OTHER): Payer: BLUE CROSS/BLUE SHIELD | Admitting: Internal Medicine

## 2014-08-30 VITALS — BP 100/80 | HR 61 | Ht 74.0 in | Wt 259.4 lb

## 2014-08-30 DIAGNOSIS — R0602 Shortness of breath: Secondary | ICD-10-CM | POA: Diagnosis not present

## 2014-08-30 DIAGNOSIS — N5201 Erectile dysfunction due to arterial insufficiency: Secondary | ICD-10-CM

## 2014-08-30 DIAGNOSIS — I471 Supraventricular tachycardia: Secondary | ICD-10-CM | POA: Diagnosis not present

## 2014-08-30 DIAGNOSIS — I10 Essential (primary) hypertension: Secondary | ICD-10-CM | POA: Diagnosis not present

## 2014-08-30 MED ORDER — SILDENAFIL CITRATE 50 MG PO TABS: 50.0000 mg | ORAL_TABLET | Freq: Every day | ORAL | Status: DC | PRN

## 2014-08-30 MED ORDER — METOPROLOL SUCCINATE ER 50 MG PO TB24
ORAL_TABLET | ORAL | Status: DC
Start: 2014-08-30 — End: 2014-12-23

## 2014-08-30 NOTE — Progress Notes (Signed)
      HPI [name] returns today  For followup of SVT. He is a very pleasant middle-age man who I saw initially several years ago. He presented to the emergency room with supraventricular tachycardia at a rate of 240 beats per minute, for which he was treated with intravenous adenosine, which failed to terminate his arrhythmia. He was cardioverted. He was placed on a beta blocker. He's had minimal palpitations in the last 2 years on his beta blocker. He has been bothered by worsening erectile dysfunction. He wonders about taking medication for this. He has not had syncope. In addition, he would like to stop his beta blocker, out of concerns for ED. No Known Allergies   Current Outpatient Prescriptions  Medication Sig Dispense Refill  . metoprolol succinate (TOPROL-XL) 50 MG 24 hr tablet Take 1/2 tablet every other day by mouth    . sildenafil (VIAGRA) 50 MG tablet Take 1 tablet (50 mg total) by mouth daily as needed for erectile dysfunction. 10 tablet 1   No current facility-administered medications for this visit.     Past Medical History  Diagnosis Date  . PSVT (paroxysmal supraventricular tachycardia)   . GERD (gastroesophageal reflux disease)     ROS:   All systems reviewed and negative except as noted in the HPI.   Past Surgical History  Procedure Laterality Date  . Right toe surgery       Family History  Problem Relation Age of Onset  . Aneurysm Mother 24    brain  . Congestive Heart Failure Father   . Diabetes Father      History   Social History  . Marital Status: Divorced    Spouse Name: N/A  . Number of Children: N/A  . Years of Education: N/A   Occupational History  . lawn care    Social History Main Topics  . Smoking status: Never Smoker   . Smokeless tobacco: Not on file  . Alcohol Use: Yes  . Drug Use: No  . Sexual Activity: Not on file   Other Topics Concern  . Not on file   Social History Narrative     BP 100/80 mmHg  Pulse 61   Ht 6\' 2"  (1.88 m)  Wt 259 lb 6.4 oz (117.663 kg)  BMI 33.29 kg/m2  Physical Exam:  Well appearing 54 year old man, NAD HEENT: Unremarkable Neck:  No JVD, no thyromegally Back:  No CVA tenderness Lungs:  Clear with no wheezes, rales, or rhonchi. HEART:  Regular rate rhythm, no murmurs, no rubs, no clicks Abd:  soft, positive bowel sounds, no organomegally, no rebound, no guarding Ext:  2 plus pulses, no edema, no cyanosis, no clubbing Skin:  No rashes no nodules Neuro:  CN II through XII intact, motor grossly intact  EKG - normal sinus rhythm with normal axis and intervals. There is no ventricular preexcitation.   Assess/Plan:

## 2014-08-30 NOTE — Assessment & Plan Note (Signed)
He has been given a prescription for Viagra. He is instructed to fill it if his erectile dysfunction does not improve with discontinuation of his beta blocker.

## 2014-08-30 NOTE — Assessment & Plan Note (Signed)
His blood pressure is currently well-controlled. He notes that he will maintain a low-sodium diet. He is trying to lose weight.

## 2014-08-30 NOTE — Assessment & Plan Note (Signed)
The patient's symptoms are well-controlled. He would like to stop his beta blocker therapy. I have instructed him to gradually reduce the dose over the next 2-3 weeks. Hopefully he will have no recurrent SVT. We did discuss catheter ablation today. He currently is not interested in pursuing this option.

## 2014-08-30 NOTE — Patient Instructions (Addendum)
Medication Instructions:  Your physician has recommended you make the following change in your medication:  1) Decrease to 25mg -- 1/2 tablet every other day for 2 weeks then stop  2) Viagra 50 mg as needed   Labwork: None ordered  Testing/Procedures: None ordered  Follow-Up: Your physician recommends that you schedule a follow-up appointment as needed    Any Other Special Instructions Will Be Listed Below (If Applicable).

## 2014-08-30 NOTE — Assessment & Plan Note (Signed)
His dyspnea has resolved. He is encouraged to increase his physical activity.

## 2014-09-22 DIAGNOSIS — R252 Cramp and spasm: Secondary | ICD-10-CM | POA: Insufficient documentation

## 2014-09-22 DIAGNOSIS — I480 Paroxysmal atrial fibrillation: Secondary | ICD-10-CM | POA: Insufficient documentation

## 2014-09-23 DIAGNOSIS — E782 Mixed hyperlipidemia: Secondary | ICD-10-CM | POA: Insufficient documentation

## 2014-10-21 ENCOUNTER — Telehealth: Payer: Self-pay | Admitting: Internal Medicine

## 2014-10-21 DIAGNOSIS — I471 Supraventricular tachycardia: Secondary | ICD-10-CM

## 2014-10-21 MED ORDER — SILDENAFIL CITRATE 50 MG PO TABS: 50.0000 mg | ORAL_TABLET | Freq: Every day | ORAL | Status: DC | PRN

## 2014-10-21 NOTE — Telephone Encounter (Signed)
New message      Talk to [facility] regarding a new presc---

## 2014-10-21 NOTE — Telephone Encounter (Signed)
Spoke with patient and let him know I would call into [facility] for him  Viagra 50 mg as needed for ED  #25 with 1 refill

## 2014-12-22 ENCOUNTER — Observation Stay (HOSPITAL_BASED_OUTPATIENT_CLINIC_OR_DEPARTMENT_OTHER)
Admission: EM | Admit: 2014-12-22 | Discharge: 2014-12-23 | Disposition: A | Discharge status: Home / Self Care | Payer: BLUE CROSS/BLUE SHIELD | Attending: Internal Medicine | Admitting: Internal Medicine

## 2014-12-22 ENCOUNTER — Emergency Department (HOSPITAL_BASED_OUTPATIENT_CLINIC_OR_DEPARTMENT_OTHER): Payer: BLUE CROSS/BLUE SHIELD

## 2014-12-22 ENCOUNTER — Encounter (HOSPITAL_BASED_OUTPATIENT_CLINIC_OR_DEPARTMENT_OTHER): Payer: Self-pay | Admitting: Emergency Medicine

## 2014-12-22 DIAGNOSIS — E669 Obesity, unspecified: Secondary | ICD-10-CM | POA: Insufficient documentation

## 2014-12-22 DIAGNOSIS — R002 Palpitations: Secondary | ICD-10-CM | POA: Diagnosis present

## 2014-12-22 DIAGNOSIS — Z7982 Long term (current) use of aspirin: Secondary | ICD-10-CM | POA: Insufficient documentation

## 2014-12-22 DIAGNOSIS — Z6832 Body mass index (BMI) 32.0-32.9, adult: Secondary | ICD-10-CM | POA: Diagnosis not present

## 2014-12-22 DIAGNOSIS — I4892 Unspecified atrial flutter: Secondary | ICD-10-CM | POA: Diagnosis not present

## 2014-12-22 DIAGNOSIS — Z79899 Other long term (current) drug therapy: Secondary | ICD-10-CM | POA: Insufficient documentation

## 2014-12-22 DIAGNOSIS — R0683 Snoring: Secondary | ICD-10-CM | POA: Insufficient documentation

## 2014-12-22 LAB — CBC
HCT: 45.9 % (ref 39.0–52.0)
Hemoglobin: 16.1 g/dL (ref 13.0–17.0)
MCH: 30.5 pg (ref 26.0–34.0)
MCHC: 35.1 g/dL (ref 30.0–36.0)
MCV: 86.9 fL (ref 78.0–100.0)
PLATELETS: 213 10*3/uL (ref 150–400)
RBC: 5.28 MIL/uL (ref 4.22–5.81)
RDW: 12.6 % (ref 11.5–15.5)
WBC: 9.8 10*3/uL (ref 4.0–10.5)

## 2014-12-22 MED ORDER — ASPIRIN 81 MG PO CHEW
324.0000 mg | CHEWABLE_TABLET | Freq: Once | ORAL | Status: AC
  Administered 2014-12-22: 324 mg via ORAL
  Filled 2014-12-22: qty 4

## 2014-12-22 MED ORDER — SODIUM CHLORIDE 0.9 % IV SOLN
INTRAVENOUS | Status: DC
  Administered 2014-12-23: 02:00:00 via INTRAVENOUS

## 2014-12-22 MED ORDER — DILTIAZEM LOAD VIA INFUSION
10.0000 mg | Freq: Once | INTRAVENOUS | Status: AC
  Administered 2014-12-22: 10 mg via INTRAVENOUS
  Filled 2014-12-22: qty 10

## 2014-12-22 MED ORDER — SODIUM CHLORIDE 0.9 % IV BOLUS (SEPSIS)
1000.0000 mL | Freq: Once | INTRAVENOUS | Status: AC
  Administered 2014-12-22: 1000 mL via INTRAVENOUS

## 2014-12-22 MED ORDER — ADENOSINE 6 MG/2ML IV SOLN
INTRAVENOUS | Status: AC
  Filled 2014-12-22: qty 2

## 2014-12-22 MED ORDER — ADENOSINE 6 MG/2ML IV SOLN
6.0000 mg | Freq: Once | INTRAVENOUS | Status: AC
  Administered 2014-12-22: 6 mg via INTRAVENOUS

## 2014-12-22 MED ORDER — DILTIAZEM HCL 100 MG IV SOLR
5.0000 mg/h | INTRAVENOUS | Status: DC
  Administered 2014-12-22: 5 mg/h via INTRAVENOUS
  Filled 2014-12-22: qty 100

## 2014-12-22 NOTE — ED Provider Notes (Signed)
This chart was scribed for Bode, DO by Irene Pap, ED Scribe. This patient was seen in room MHT14/MHT14 and patient care was started at 11:14 PM.   TIME SEEN: 11:14 PM  CHIEF COMPLAINT: palpitations  HPI:  [name] is a 54 y.o. male with hx of PSVT  that resolved with cardioversion who presents to the Emergency Department complaining of heart palpitations onset 5 hours ago. States that it felt like his heart was going very fast. States he was previously on the TURP all but has been tapered off this medication. Is on an aspirin daily but no other anti-platelet agent or anticoagulation.. Denies fever, chills, nausea, vomiting, SOB, chest pain, chest tightness, numbness or weakness.   Cardiologist: [name], MD PCP: Dr. Trudie Reed  ROS: See HPI Constitutional: no fever  Eyes: no drainage  ENT: no runny nose   Cardiovascular:  no chest pain  Resp: no SOB  GI: no vomiting GU: no dysuria Integumentary: no rash  Allergy: no hives  Musculoskeletal: no leg swelling  Neurological: no slurred speech ROS otherwise negative  PAST MEDICAL HISTORY/PAST SURGICAL HISTORY:  Past Medical History  Diagnosis Date  . PSVT (paroxysmal supraventricular tachycardia)   . GERD (gastroesophageal reflux disease)     MEDICATIONS:  Prior to Admission medications   Medication Sig Start Date End Date Taking? Authorizing Provider  metoprolol succinate (TOPROL-XL) 50 MG 24 hr tablet Take 1/2 tablet every other day by mouth 08/30/14   Evans Lance, MD  sildenafil (VIAGRA) 50 MG tablet Take 1 tablet (50 mg total) by mouth daily as needed for erectile dysfunction. 10/21/14   Evans Lance, MD    ALLERGIES:  No Known Allergies  SOCIAL HISTORY:  Social History  Substance Use Topics  . Smoking status: Never Smoker   . Smokeless tobacco: Not on file  . Alcohol Use: Yes    FAMILY HISTORY: Family History  Problem Relation Age of Onset  . Aneurysm Mother 63    brain  . Congestive  Heart Failure Father   . Diabetes Father     EXAM: BP 123/89 mmHg  Pulse 137  Temp(Src) 98.2 F (36.8 C) (Oral)  Resp 18  Ht 6\' 2"  (1.88 m)  Wt 255 lb (115.667 kg)  BMI 32.73 kg/m2  SpO2 98%  CONSTITUTIONAL: Alert and oriented and responds appropriately to questions. Well-appearing; well-nourished, in no distress HEAD: Normocephalic EYES: Conjunctivae clear, PERRL ENT: normal nose; no rhinorrhea; moist mucous membranes; pharynx without lesions noted NECK: Supple, no meningismus, no LAD  CARD: irregularly irregular, tachycardic; S1 and S2 appreciated; no murmurs, no clicks, no rubs, no gallops RESP: Normal chest excursion without splinting or tachypnea; breath sounds clear and equal bilaterally; no wheezes, no rhonchi, no rales, no hypoxia or respiratory distress, speaking full sentences ABD/GI: Normal bowel sounds; non-distended; soft, non-tender, no rebound, no guarding, no peritoneal signs BACK:  The back appears normal and is non-tender to palpation, there is no CVA tenderness EXT: Normal ROM in all joints; non-tender to palpation; no edema; normal capillary refill; no cyanosis, no calf tenderness or swelling    SKIN: Normal color for age and race; warm NEURO: Moves all extremities equally, sensation to light touch intact diffusely, cranial nerves II through XII intact PSYCH: The patient's mood and manner are appropriate. Grooming and personal hygiene are appropriate.  MEDICAL DECISION MAKING: Patient here and what appears to be atrial flutter with rapid ventricular response. Have attempted vagal maneuvers and 6 mg of adenosine without  any relief. I do not think this is SVT. Per his prior cardiology notes, patient has a history of SVT and not atrial fibrillation or atrial flutter. He is not on anticoagulation. Currently asymptomatic other than having palpitations. Blood pressure is within normal limits. We'll give IV fluids and start diltiazem. Will obtain cardiac labs, chest x-ray.  Anticipate admission.  ED PROGRESS: Patient's labs are unremarkable. Potassium is 3.5, magnesium 1.9. Will replace. Troponin negative. Chest x-ray clear.   Patient's heart rate has improved and he is now normal sinus rhythm on 10 mg of diltiazem per hour. Discussed with Dr. Hal Hope with hospitalist service who would like patient admitted to step down. We'll transfer to [facility].    EKG Interpretation  Date/Time:  Thursday December 22 2014 23:08:09 EDT Ventricular Rate:  148 PR Interval:    QRS Duration: 86 QT Interval:  266 QTC Calculation: 417 R Axis:   35 Text Interpretation:  Atrial flutter with variable A-V block Nonspecific ST and T wave abnormality Abnormal ECG Confirmed by Karsen Nakanishi,  DO, Alessander Sikorski [id number]) on 12/23/2014 1:31:46 AM         EKG Interpretation  Date/Time:  Friday December 23 2014 01:17:12 EDT Ventricular Rate:  70 PR Interval:  148 QRS Duration: 88 QT Interval:  400 QTC Calculation: 432 R Axis:   35 Text Interpretation:  Normal sinus rhythm Normal ECG Confirmed by Rosaire Cueto,  DO, Shakai Dolley ([id number]) on 12/23/2014 1:32:39 AM        CRITICAL CARE Performed by: Nyra Jabs   Total critical care time: 40 minutes  Critical care time was exclusive of separately billable procedures and treating other patients.  Critical care was necessary to treat or prevent imminent or life-threatening deterioration.  Critical care was time spent personally by me on the following activities: development of treatment plan with patient and/or surrogate as well as nursing, discussions with consultants, evaluation of patient's response to treatment, examination of patient, obtaining history from patient or surrogate, ordering and performing treatments and interventions, ordering and review of laboratory studies, ordering and review of radiographic studies, pulse oximetry and re-evaluation of patient's condition.   I personally performed the services described in this  documentation, which was scribed in my presence. The recorded information has been reviewed and is accurate.    [place], DO 12/23/14 [phone]

## 2014-12-22 NOTE — ED Notes (Signed)
MD at bedside. 

## 2014-12-22 NOTE — ED Notes (Signed)
Palpitations since 6pm.  Reports hx of A-fib with cardioversion.

## 2014-12-23 ENCOUNTER — Encounter (HOSPITAL_COMMUNITY): Payer: Self-pay | Admitting: Internal Medicine

## 2014-12-23 ENCOUNTER — Other Ambulatory Visit: Payer: Self-pay | Admitting: Nurse Practitioner

## 2014-12-23 ENCOUNTER — Observation Stay (HOSPITAL_BASED_OUTPATIENT_CLINIC_OR_DEPARTMENT_OTHER): Payer: BLUE CROSS/BLUE SHIELD

## 2014-12-23 DIAGNOSIS — Z6832 Body mass index (BMI) 32.0-32.9, adult: Secondary | ICD-10-CM | POA: Diagnosis not present

## 2014-12-23 DIAGNOSIS — I4891 Unspecified atrial fibrillation: Secondary | ICD-10-CM | POA: Diagnosis not present

## 2014-12-23 DIAGNOSIS — R0683 Snoring: Secondary | ICD-10-CM | POA: Insufficient documentation

## 2014-12-23 DIAGNOSIS — R002 Palpitations: Secondary | ICD-10-CM | POA: Diagnosis present

## 2014-12-23 DIAGNOSIS — I4892 Unspecified atrial flutter: Secondary | ICD-10-CM | POA: Diagnosis present

## 2014-12-23 DIAGNOSIS — G4730 Sleep apnea, unspecified: Secondary | ICD-10-CM

## 2014-12-23 DIAGNOSIS — E669 Obesity, unspecified: Secondary | ICD-10-CM | POA: Diagnosis not present

## 2014-12-23 DIAGNOSIS — R400 Somnolence: Secondary | ICD-10-CM

## 2014-12-23 LAB — BASIC METABOLIC PANEL
ANION GAP: 11 (ref 5–15)
BUN: 27 mg/dL — ABNORMAL HIGH (ref 6–20)
CALCIUM: 8.8 mg/dL — AB (ref 8.9–10.3)
CO2: 25 mmol/L (ref 22–32)
CREATININE: 0.93 mg/dL (ref 0.61–1.24)
Chloride: 106 mmol/L (ref 101–111)
Glucose, Bld: 107 mg/dL — ABNORMAL HIGH (ref 65–99)
Potassium: 3.5 mmol/L (ref 3.5–5.1)
SODIUM: 142 mmol/L (ref 135–145)

## 2014-12-23 LAB — CBC WITH DIFFERENTIAL/PLATELET
BASOS ABS: 0 10*3/uL (ref 0.0–0.1)
Basophils Relative: 0 % (ref 0–1)
EOS PCT: 2 % (ref 0–5)
Eosinophils Absolute: 0.1 10*3/uL (ref 0.0–0.7)
HEMATOCRIT: 39.9 % (ref 39.0–52.0)
HEMOGLOBIN: 14 g/dL (ref 13.0–17.0)
LYMPHS ABS: 3 10*3/uL (ref 0.7–4.0)
LYMPHS PCT: 46 % (ref 12–46)
MCH: 30.8 pg (ref 26.0–34.0)
MCHC: 35.1 g/dL (ref 30.0–36.0)
MCV: 87.9 fL (ref 78.0–100.0)
MONO ABS: 0.5 10*3/uL (ref 0.1–1.0)
MONOS PCT: 8 % (ref 3–12)
Neutro Abs: 2.9 10*3/uL (ref 1.7–7.7)
Neutrophils Relative %: 44 % (ref 43–77)
Platelets: 185 10*3/uL (ref 150–400)
RBC: 4.54 MIL/uL (ref 4.22–5.81)
RDW: 12.7 % (ref 11.5–15.5)
WBC: 6.5 10*3/uL (ref 4.0–10.5)

## 2014-12-23 LAB — COMPREHENSIVE METABOLIC PANEL
ALBUMIN: 3.3 g/dL — AB (ref 3.5–5.0)
ALT: 35 U/L (ref 17–63)
AST: 28 U/L (ref 15–41)
Alkaline Phosphatase: 62 U/L (ref 38–126)
Anion gap: 9 (ref 5–15)
BILIRUBIN TOTAL: 0.5 mg/dL (ref 0.3–1.2)
BUN: 22 mg/dL — AB (ref 6–20)
CO2: 24 mmol/L (ref 22–32)
Calcium: 8.3 mg/dL — ABNORMAL LOW (ref 8.9–10.3)
Chloride: 107 mmol/L (ref 101–111)
Creatinine, Ser: 0.82 mg/dL (ref 0.61–1.24)
GFR calc Af Amer: >60 mL/min (ref >60)
GFR calc non Af Amer: >60 mL/min (ref >60)
GLUCOSE: 104 mg/dL — AB (ref 65–99)
POTASSIUM: 4.3 mmol/L (ref 3.5–5.1)
Sodium: 140 mmol/L (ref 135–145)
TOTAL PROTEIN: 5.3 g/dL — AB (ref 6.5–8.1)

## 2014-12-23 LAB — TROPONIN I: Troponin I: <0.03 ng/mL (ref <0.031)

## 2014-12-23 LAB — MRSA PCR SCREENING: MRSA by PCR: NEGATIVE

## 2014-12-23 LAB — T4, FREE: FREE T4: 0.83 ng/dL (ref 0.61–1.12)

## 2014-12-23 LAB — TSH: TSH: 2.107 u[IU]/mL (ref 0.350–4.500)

## 2014-12-23 LAB — MAGNESIUM: MAGNESIUM: 1.9 mg/dL (ref 1.7–2.4)

## 2014-12-23 MED ORDER — METOPROLOL SUCCINATE 12.5 MG HALF TABLET
12.5000 mg | ORAL_TABLET | Freq: Every day | ORAL | Status: DC
Start: 2014-12-23 — End: 2014-12-23
  Filled 2014-12-23: qty 1

## 2014-12-23 MED ORDER — DILTIAZEM HCL 30 MG PO TABS: 30.0000 mg | ORAL_TABLET | Freq: Two times a day (BID) | ORAL | Status: DC | PRN

## 2014-12-23 MED ORDER — ACETAMINOPHEN 325 MG PO TABS: 650.0000 mg | ORAL_TABLET | Freq: Four times a day (QID) | ORAL | Status: DC | PRN

## 2014-12-23 MED ORDER — MAGNESIUM OXIDE 400 (241.3 MG) MG PO TABS
800.0000 mg | ORAL_TABLET | Freq: Once | ORAL | Status: DC
  Filled 2014-12-23: qty 2

## 2014-12-23 MED ORDER — ONDANSETRON HCL 4 MG/2ML IJ SOLN: 4.0000 mg | Freq: Four times a day (QID) | INTRAMUSCULAR | Status: DC | PRN

## 2014-12-23 MED ORDER — ACETAMINOPHEN 650 MG RE SUPP: 650.0000 mg | Freq: Four times a day (QID) | RECTAL | Status: DC | PRN

## 2014-12-23 MED ORDER — SODIUM CHLORIDE 0.9 % IV SOLN: INTRAVENOUS | Status: DC

## 2014-12-23 MED ORDER — DEXTROSE 5 % IV SOLN
INTRAVENOUS | Status: AC
  Filled 2014-12-23: qty 100

## 2014-12-23 MED ORDER — ENOXAPARIN SODIUM 40 MG/0.4ML ~~LOC~~ SOLN
40.0000 mg | SUBCUTANEOUS | Status: DC
  Administered 2014-12-23: 40 mg via SUBCUTANEOUS
  Filled 2014-12-23: qty 0.4

## 2014-12-23 MED ORDER — ONDANSETRON HCL 4 MG PO TABS: 4.0000 mg | ORAL_TABLET | Freq: Four times a day (QID) | ORAL | Status: DC | PRN

## 2014-12-23 MED ORDER — SODIUM CHLORIDE 0.9 % IV BOLUS (SEPSIS)
1000.0000 mL | Freq: Once | INTRAVENOUS | Status: AC
Start: 2014-12-23 — End: 2014-12-23
  Administered 2014-12-23: 1000 mL via INTRAVENOUS

## 2014-12-23 MED ORDER — ASPIRIN EC 325 MG PO TBEC
325.0000 mg | DELAYED_RELEASE_TABLET | Freq: Every day | ORAL | Status: DC
  Administered 2014-12-23: 325 mg via ORAL
  Filled 2014-12-23: qty 1

## 2014-12-23 MED ORDER — POTASSIUM CHLORIDE CRYS ER 20 MEQ PO TBCR
40.0000 meq | EXTENDED_RELEASE_TABLET | Freq: Once | ORAL | Status: AC
  Administered 2014-12-23: 40 meq via ORAL
  Filled 2014-12-23: qty 2

## 2014-12-23 NOTE — ED Notes (Signed)
MD at bedside. 

## 2014-12-23 NOTE — Consult Note (Signed)
ELECTROPHYSIOLOGY CONSULT NOTE    Patient ID: [name] MRN: [id number], DOB/AGE: [birth year] 53 y.o.  Admit date: 12/22/2014 Date of Consult: 12/23/2014  Primary Physician: [name], MD Electrophysiologist: Lovena Le  Reason for Consultation: tachycardia  HPI:  [name] is a 54 y.o. male with a past medical history significant for SVT and GERD.   He has been followed by Dr Lovena Le and was previously treated with BB which controlled his tachycardia but caused ED and the patient weaned himself off of this in April of this year.  He presented to [facility] last night with palpitations and was found to be in a narrow complex tachycardia at a rate of 148.  He was placed on Cardizem drip and converted to SR.  He was then transferred to [facility] for further evaluation.    He has never had an echocardiogram.  Pending this admission.  Lab work is reviewed.  He currently denies chest pain, shortness of breath, LE edema, recent fevers, chills, nausea or vomiting.  He has not had frequent palpitations.   Past Medical History  Diagnosis Date  . PSVT (paroxysmal supraventricular tachycardia)   . GERD (gastroesophageal reflux disease)      Surgical History:  Past Surgical History  Procedure Laterality Date  . Right toe surgery       Prescriptions prior to admission  Medication Sig Dispense Refill Last Dose  . aspirin EC 81 MG tablet Take 81 mg by mouth daily.   12/22/2014 at Unknown time  . sildenafil (VIAGRA) 50 MG tablet Take 1 tablet (50 mg total) by mouth daily as needed for erectile dysfunction. 25 tablet 1 Past Month at Unknown time    Inpatient Medications:  . aspirin EC  325 mg Oral Daily  . diltiazem (CARDIZEM) infusion      . enoxaparin (LOVENOX) injection  40 mg Subcutaneous Q24H  . metoprolol succinate  12.5 mg Oral Daily    Allergies: No Known Allergies  Social History   Social History  . Marital Status: Divorced    Spouse Name: N/A  .  Number of Children: N/A  . Years of Education: N/A   Occupational History  . lawn care    Social History Main Topics  . Smoking status: Never Smoker   . Smokeless tobacco: Not on file  . Alcohol Use: Yes  . Drug Use: No  . Sexual Activity: Not on file   Other Topics Concern  . Not on file   Social History Narrative     Family History  Problem Relation Age of Onset  . Aneurysm Mother 33    brain  . Congestive Heart Failure Father   . Diabetes Father      Review of Systems: All other systems reviewed and are otherwise negative except as noted above.  Physical Exam: Filed Vitals:   12/23/14 0400 12/23/14 0500 12/23/14 0600 12/23/14 0757  BP: 126/93 127/70 123/78 105/64  Pulse: 68 72 68 75  Temp:    97.9 F (36.6 C)  TempSrc:    Oral  Resp: 15 19 15 14   Height:      Weight:      SpO2: 96% 96% 98% 98%    GEN- The patient is obese appearing, alert and oriented x 3 today.   HEENT: normocephalic, atraumatic; sclera clear, conjunctiva pink; hearing intact; oropharynx clear; neck supple  Lungs- Clear to ausculation bilaterally, normal work of breathing.  No wheezes, rales, rhonchi Heart- Regular rate and rhythm  GI- obese, non-tender, non-distended, bowel sounds present, no hepatosplenomegaly Extremities- no clubbing, cyanosis, or edema; DP/PT/radial pulses 2+ bilaterally MS- no significant deformity or atrophy Skin- warm and dry, no rash or lesion Psych- euthymic mood, full affect Neuro- strength and sensation are intact  Labs:   Lab Results  Component Value Date   WBC 6.5 12/23/2014   HGB 14.0 12/23/2014   HCT 39.9 12/23/2014   MCV 87.9 12/23/2014   PLT 185 12/23/2014    Recent Labs Lab 12/23/14 0608  NA 140  K 4.3  CL 107  CO2 24  BUN 22*  CREATININE 0.82  CALCIUM 8.3*  PROT 5.3*  BILITOT 0.5  ALKPHOS 62  ALT 35  AST 28  GLUCOSE 104*      Radiology/Studies: Dg Chest Portable 1 View 12/23/2014   CLINICAL DATA:  Acute onset of heart  palpitations. Initial encounter.  EXAM: PORTABLE CHEST - 1 VIEW  COMPARISON:  Chest radiograph from 01/16/2011  FINDINGS: The lungs are well-aerated and clear. There is no evidence of focal opacification, pleural effusion or pneumothorax.  The cardiomediastinal silhouette is within normal limits. No acute osseous abnormalities are seen.  IMPRESSION: No acute cardiopulmonary process seen.   Electronically Signed   By: Garald Balding M.D.   On: 12/23/2014 00:35    EKG:2:1 typical atrial flutter, rate 148  TELEMETRY: sinus rhythm  Assessment/Plan: 1.  Atrial flutter The patient presented with palpitations and was found to be in atrial flutter.  He also has documented previous atrial fibrillation. He had been treated with metoprolol for palpitations in the past but this had to be discontinued 2/2 erectile dysfunction.  He converted to SR with IV diltiazem.  Discussed treatment options with patient including alternative medications vs ablation.  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  I discussed case with Dr Lovena Le - he would prefer to ablate atrial flutter for now and monitor for recurrence of atrial fibrillation (has not been documented in several years).  For now, would give Cardizem 30mg  to take prn for palpitations.  Would also give Rx for long acting cardizem in case he has frequent recurrences of palpitations.  I will arrange outpatient follow up with Dr Lovena Le later this year to schedule flutter ablation for January. CHADS2VASC is 0 - no anticoagulation needed.   2.  Snoring/daytime somnolence Will order outpatient sleep evaluation.   Dr Rayann Heman to see later this morning - would be ok to discharge to home today from EP standpoint after echocardiogram done.   Signed, Chanetta Marshall, NP 12/23/2014 10:11 AM   I have seen, examined the patient, and reviewed the above assessment and plan.   On exam,  RRR. Changes to above are made where necessary.   Stop IV diltiazem.  Will give prn cardizem at home.  He will follow-up with Dr Lovena Le.   Electrophysiology team to see as needed while here. Please call with questions.   Co Sign: Thompson Grayer, MD 12/23/2014 1:59 PM

## 2014-12-23 NOTE — Discharge Summary (Signed)
Physician Discharge Summary  [name] [id number] DOB: 22-Jun-1960 DOA: 12/22/2014  PCP: [name], MD  Admit date: 12/22/2014 Discharge date: 12/23/2014  Time spent: >35 minutes  Recommendations for Outpatient Follow-up:  F/u with PCP in 1-2 weeks as needed F/u with cardiology in 3-4 weeks   Discharge Diagnoses:  Principal Problem:   Atrial flutter with rapid ventricular response Active Problems:   Palpitations   Snoring   Discharge Condition: stable   Diet recommendation: regular   Filed Weights   12/22/14 2310  Weight: 115.667 kg (255 lb)    History of present illness:  54 Y/o with PMH of PSVT, controlled on BB (but patient weaned himself off due to side effect-erectile dysfunction) presented with palpitations, found to have a flutter with HR 148.  -admitted with tachycardia, a flutter     Hospital Course:  1. A flutter-resolved. Initially required IV Cardizem, currently is in NSR. Per cardiology: recommended to use prn Cardizem for palpitations and f/u with Dr Lovena Le to schedule flutter ablation.  Echo is pending at the time of discharge, cardiology decided to f/u as outpatient    Procedures:  Echo -pend  (i.e. Studies not automatically included, echos, thoracentesis, etc; not x-rays)  Consultations:  Cardiology   Discharge Exam: Filed Vitals:   12/23/14 1611  BP: 125/80  Pulse: 64  Temp: 97.7 F (36.5 C)  Resp: 17    General: alert, no distress  Cardiovascular: s1,s2 rrr Respiratory: CTA BL  Discharge Instructions  Discharge Instructions    Diet - low sodium heart healthy    Complete by:  As directed      Discharge instructions    Complete by:  As directed   Please follow up with primary care doctor in 1-2 weeks as needed Please follow up with Cardiology in 3-4 weeks     Increase activity slowly    Complete by:  As directed             Medication List    TAKE these medications        aspirin EC 81 MG tablet  Take 81  mg by mouth daily.     diltiazem 30 MG tablet  Commonly known as:  CARDIZEM  Take 1 tablet (30 mg total) by mouth 2 (two) times daily as needed (as needed for palpitations).     sildenafil 50 MG tablet  Commonly known as:  VIAGRA  Take 1 tablet (50 mg total) by mouth daily as needed for erectile dysfunction.       No Known Allergies     Follow-up Information    Follow up with [name], MD On 04/04/2015.   Specialty:  Cardiology   Why:  at 9:15AM   Contact information:   [place]. [place] [address] [phone]       Follow up with [name], MD In 2 weeks.   Specialty:  Family Medicine   Contact information:   [address] [facility] [place] [address] [phone]        The results of significant diagnostics from this hospitalization (including imaging, microbiology, ancillary and laboratory) are listed below for reference.    Significant Diagnostic Studies: Dg Chest Portable 1 View  12/23/2014   CLINICAL DATA:  Acute onset of heart palpitations. Initial encounter.  EXAM: PORTABLE CHEST - 1 VIEW  COMPARISON:  Chest radiograph from 01/16/2011  FINDINGS: The lungs are well-aerated and clear. There is no evidence of focal opacification,  pleural effusion or pneumothorax.  The cardiomediastinal silhouette is within normal limits. No acute osseous abnormalities are seen.  IMPRESSION: No acute cardiopulmonary process seen.   Electronically Signed   By: Garald Balding M.D.   On: 12/23/2014 00:35    Microbiology: Recent Results (from the past 240 hour(s))  MRSA PCR Screening     Status: None   Collection Time: 12/23/14  3:43 AM  Result Value Ref Range Status   MRSA by PCR NEGATIVE NEGATIVE Final    Comment:        The GeneXpert MRSA Assay (FDA approved for NASAL specimens only), is one component of a comprehensive MRSA colonization surveillance program. It is not intended to diagnose MRSA infection nor to guide  or monitor treatment for MRSA infections.      Labs: Basic Metabolic Panel:  Recent Labs Lab 12/23/14 0010 12/23/14 0608  NA 142 140  K 3.5 4.3  CL 106 107  CO2 25 24  GLUCOSE 107* 104*  BUN 27* 22*  CREATININE 0.93 0.82  CALCIUM 8.8* 8.3*  MG 1.9  --    Liver Function Tests:  Recent Labs Lab 12/23/14 0608  AST 28  ALT 35  ALKPHOS 62  BILITOT 0.5  PROT 5.3*  ALBUMIN 3.3*   No results for input(s): LIPASE, AMYLASE in the last 168 hours. No results for input(s): AMMONIA in the last 168 hours. CBC:  Recent Labs Lab 12/22/14 2315 12/23/14 0608  WBC 9.8 6.5  NEUTROABS  --  2.9  HGB 16.1 14.0  HCT 45.9 39.9  MCV 86.9 87.9  PLT 213 185   Cardiac Enzymes:  Recent Labs Lab 12/22/14 2315 12/23/14 0608  TROPONINI <0.03 <0.03   BNP: BNP (last 3 results) No results for input(s): BNP in the last 8760 hours.  ProBNP (last 3 results) No results for input(s): PROBNP in the last 8760 hours.  CBG: No results for input(s): GLUCAP in the last 168 hours.     SignedKinnie Feil  [facility] 12/23/2014, 4:25 PM

## 2014-12-23 NOTE — H&P (Signed)
[facility] History and Physical  JARROD MCENERY ZOX:[id number] DOB: [birth year] DOA: 12/22/2014  Referring physician: Dr. Leonides Schanz. Patient was transferred from [facility] [place]. PCP: [name], MD  Specialists: Dr. Crissie Sickles.  Chief Complaint: Palpitations.  HPI: Steven KLIETHERMES is a 54 y.o. male with history of PSVT presents to the ER with complaints of palpitations since last evening. Patient had mild dizziness but denies any loss of consciousness chest pain shortness of breath. Patient used to be on Toprol-XL which patient had tapered off few months ago. Patient has previous history of PSVT. In the ER patient was found to be tachycardic and EKG was showing features concerning for a flutter with RVR. Patient was started on Cardizem infusion and admitted for further management.   Review of Systems: As presented in the history of presenting illness, rest negative.  Past Medical History  Diagnosis Date  . PSVT (paroxysmal supraventricular tachycardia)   . GERD (gastroesophageal reflux disease)    Past Surgical History  Procedure Laterality Date  . Right toe surgery     Social History:  reports that he has never smoked. He does not have any smokeless tobacco history on file. He reports that he drinks alcohol. He reports that he does not use illicit drugs. Where does patient live home. Can patient participate in ADLs? Yes.  No Known Allergies  Family History:  Family History  Problem Relation Age of Onset  . Aneurysm Mother 93    brain  . Congestive Heart Failure Father   . Diabetes Father       Prior to Admission medications   Medication Sig Start Date End Date Taking? Authorizing Provider  metoprolol succinate (TOPROL-XL) 50 MG 24 hr tablet Take 1/2 tablet every other day by mouth 08/30/14   Evans Lance, MD  sildenafil (VIAGRA) 50 MG tablet Take 1 tablet (50 mg total) by mouth daily as needed for erectile dysfunction. 10/21/14   Evans Lance, MD     Physical Exam: Filed Vitals:   12/23/14 0145 12/23/14 0200 12/23/14 0318 12/23/14 0319  BP: 113/71 118/77 124/81   Pulse: 81 70 69   Temp:    97.6 F (36.4 C)  TempSrc:    Oral  Resp: 16 14 21    Height:      Weight:      SpO2: 100% 97% 99%      General:  Moderately built and nourished.  Eyes: Anicteric no pallor.  ENT: No discharge from the ears eyes nose and mouth.  Neck: No mass felt.  Cardiovascular: S1 and S2 heard.  Respiratory: No rhonchi or crepitations.  Abdomen: Soft nontender bowel sounds present.  Skin: No rash.  Musculoskeletal: No edema.  Psychiatric: Appears normal.  Neurologic: Alert awake oriented to time place and person. Moves all extremities.  Labs on Admission:  Basic Metabolic Panel:  Recent Labs Lab 12/23/14 0010  NA 142  K 3.5  CL 106  CO2 25  GLUCOSE 107*  BUN 27*  CREATININE 0.93  CALCIUM 8.8*  MG 1.9   Liver Function Tests: No results for input(s): AST, ALT, ALKPHOS, BILITOT, PROT, ALBUMIN in the last 168 hours. No results for input(s): LIPASE, AMYLASE in the last 168 hours. No results for input(s): AMMONIA in the last 168 hours. CBC:  Recent Labs Lab 12/22/14 2315  WBC 9.8  HGB 16.1  HCT 45.9  MCV 86.9  PLT 213   Cardiac Enzymes:  Recent Labs Lab 12/22/14 2315  TROPONINI <0.03  BNP (last 3 results) No results for input(s): BNP in the last 8760 hours.  ProBNP (last 3 results) No results for input(s): PROBNP in the last 8760 hours.  CBG: No results for input(s): GLUCAP in the last 168 hours.  Radiological Exams on Admission: Dg Chest Portable 1 View  12/23/2014   CLINICAL DATA:  Acute onset of heart palpitations. Initial encounter.  EXAM: PORTABLE CHEST - 1 VIEW  COMPARISON:  Chest radiograph from 01/16/2011  FINDINGS: The lungs are well-aerated and clear. There is no evidence of focal opacification, pleural effusion or pneumothorax.  The cardiomediastinal silhouette is within normal limits. No  acute osseous abnormalities are seen.  IMPRESSION: No acute cardiopulmonary process seen.   Electronically Signed   By: Garald Balding M.D.   On: 12/23/2014 00:35    EKG: Independently reviewed. Atrial flutter with RVR. EKG discussed with cardiologist Dr. Philbert Riser.  Assessment/Plan Principal Problem:   Atrial flutter with rapid ventricular response Active Problems:   Palpitations   1. Atrial flutter with RVR with history of PSVT - have discussed the EKG with on-call cardiologist Dr. Philbert Riser. Patient resumes in sinus rhythm. I have started patient on Toprol-XL 12.5 mg by mouth daily and we will wean off Cardizem infusion. Check thyroid function tests. I have consulted cardiology for further recommendations. Patient's chads 2 vasc score is 0-1. Patient is on aspirin.   DVT Prophylaxis Lovenox.  Code Status: Full code.  Family Communication: Discussed with patient.  Disposition Plan: Admit for observation.    Kira Hartl N. [facility] Pager [phone].  If 7PM-7AM, please contact night-coverage [email or web address] Password [email or web address] 12/23/2014, 5:08 AM

## 2014-12-23 NOTE — Progress Notes (Signed)
Pt being discharged home via wheelchair with family. Pt alert and oriented x4. VSS. Pt c/o no pain at this time. No signs of respiratory distress. Education complete and care plans resolved.  Both peripheral IV's removed with catheter intact and pt tolerated well. No further issues at this time. Pt to follow up with PCP and cardiology. Leanne Chang, RN

## 2014-12-23 NOTE — Progress Notes (Signed)
  Echocardiogram 2D Echocardiogram has been performed.  Tresa Res 12/23/2014, 2:02 PM

## 2014-12-23 NOTE — Progress Notes (Signed)
[facility] PROGRESS NOTE  [name] [id number]:[id number] DOB: 30-Aug-1960 DOA: 12/22/2014 PCP: [name], MD  Assessment/Plan: 53  Y/o with PMH of PSVT, controlled on BB (but patient weaned himself off due to side effect-erectile dysfunction) presented with palpitations, found to have a flutter with HR 148.  -admitted with tachycardia   1. A flutter, initially required IV Cardizem. Patient, currently is in NSR. Per cardiology recommended to use prn Cardizem and f/u with with Dr Lovena Le later this year to schedule flutter ablation for Januar  Code Status: full Family Communication: d/w patient, RN (indicate person spoken with, relationship, and if by phone, the number) Disposition Plan: home today    Consultants:  Cardiology   Procedures:  Pend echo   Antibiotics:  none (indicate start date, and stop date if known)  HPI/Subjective: Alert, no distres   Objective: Filed Vitals:   12/23/14 1100  BP: 142/81  Pulse: 65  Temp: 97.5 F (36.4 C)  Resp: 18    Intake/Output Summary (Last 24 hours) at 12/23/14 1402 Last data filed at 12/23/14 1300  Gross per 24 hour  Intake 465.42 ml  Output      0 ml  Net 465.42 ml   Filed Weights   12/22/14 2310  Weight: 115.667 kg (255 lb)    Exam:   General:  No distress   Cardiovascular: s1,s2 rrr  Respiratory: CTA BL  Abdomen: soft, nt, nd   Musculoskeletal: no leg edema   Data Reviewed: Basic Metabolic Panel:  Recent Labs Lab 12/23/14 0010 12/23/14 0608  NA 142 140  K 3.5 4.3  CL 106 107  CO2 25 24  GLUCOSE 107* 104*  BUN 27* 22*  CREATININE 0.93 0.82  CALCIUM 8.8* 8.3*  MG 1.9  --    Liver Function Tests:  Recent Labs Lab 12/23/14 0608  AST 28  ALT 35  ALKPHOS 62  BILITOT 0.5  PROT 5.3*  ALBUMIN 3.3*   No results for input(s): LIPASE, AMYLASE in the last 168 hours. No results for input(s): AMMONIA in the last 168 hours. CBC:  Recent Labs Lab 12/22/14 2315 12/23/14 0608  WBC  9.8 6.5  NEUTROABS  --  2.9  HGB 16.1 14.0  HCT 45.9 39.9  MCV 86.9 87.9  PLT 213 185   Cardiac Enzymes:  Recent Labs Lab 12/22/14 2315 12/23/14 0608  TROPONINI <0.03 <0.03   BNP (last 3 results) No results for input(s): BNP in the last 8760 hours.  ProBNP (last 3 results) No results for input(s): PROBNP in the last 8760 hours.  CBG: No results for input(s): GLUCAP in the last 168 hours.  Recent Results (from the past 240 hour(s))  MRSA PCR Screening     Status: None   Collection Time: 12/23/14  3:43 AM  Result Value Ref Range Status   MRSA by PCR NEGATIVE NEGATIVE Final    Comment:        The GeneXpert MRSA Assay (FDA approved for NASAL specimens only), is one component of a comprehensive MRSA colonization surveillance program. It is not intended to diagnose MRSA infection nor to guide or monitor treatment for MRSA infections.      Studies: Dg Chest Portable 1 View  12/23/2014   CLINICAL DATA:  Acute onset of heart palpitations. Initial encounter.  EXAM: PORTABLE CHEST - 1 VIEW  COMPARISON:  Chest radiograph from 01/16/2011  FINDINGS: The lungs are well-aerated and clear. There is no evidence of focal opacification, pleural effusion or pneumothorax.  The cardiomediastinal  silhouette is within normal limits. No acute osseous abnormalities are seen.  IMPRESSION: No acute cardiopulmonary process seen.   Electronically Signed   By: Garald Balding M.D.   On: 12/23/2014 00:35    Scheduled Meds: . aspirin EC  325 mg Oral Daily  . diltiazem (CARDIZEM) infusion      . enoxaparin (LOVENOX) injection  40 mg Subcutaneous Q24H  . metoprolol succinate  12.5 mg Oral Daily   Continuous Infusions: . sodium chloride 10 mL/hr (12/23/14 0515)    Principal Problem:   Atrial flutter with rapid ventricular response Active Problems:   Palpitations   Snoring    Time spent: >35 minutes     Kinnie Feil  [facility] Pager [phone]. If 7PM-7AM, please contact  night-coverage at [email or web address], password [email or web address] 12/23/2014, 2:02 PM  LOS: 0 days

## 2014-12-24 LAB — T3, FREE: T3, Free: 3.5 pg/mL (ref 2.0–4.4)

## 2015-01-31 DIAGNOSIS — S99921A Unspecified injury of right foot, initial encounter: Secondary | ICD-10-CM | POA: Insufficient documentation

## 2015-04-04 ENCOUNTER — Ambulatory Visit (INDEPENDENT_AMBULATORY_CARE_PROVIDER_SITE_OTHER): Payer: BLUE CROSS/BLUE SHIELD | Admitting: Internal Medicine

## 2015-04-04 ENCOUNTER — Encounter: Payer: Self-pay | Admitting: Internal Medicine

## 2015-04-04 VITALS — BP 110/82 | HR 82 | Ht 74.0 in | Wt 257.0 lb

## 2015-04-04 DIAGNOSIS — I10 Essential (primary) hypertension: Secondary | ICD-10-CM

## 2015-04-04 DIAGNOSIS — R0683 Snoring: Secondary | ICD-10-CM

## 2015-04-04 DIAGNOSIS — I4892 Unspecified atrial flutter: Secondary | ICD-10-CM | POA: Diagnosis not present

## 2015-04-04 NOTE — Assessment & Plan Note (Signed)
I have discussed the treatment options with the patient. The risks/benefit/goals/expectations of catheter ablation have been reviewed. He will call us if he would like to proceed.

## 2015-04-04 NOTE — Patient Instructions (Addendum)
Medication Instructions:  Your physician recommends that you continue on your current medications as directed. Please refer to the Current Medication list given to you today.   Labwork: Your physician recommends that you return for lab work on 04/24/16.  You do not have to fast   Testing/Procedures: Your physician has recommended that you have a sleep study. This test records several body functions during sleep, including: brain activity, eye movement, oxygen and carbon dioxide blood levels, heart rate and rhythm, breathing rate and rhythm, the flow of air through your mouth and nose, snoring, body muscle movements, and chest and belly movement.  Your physician has recommended that you have an ablation. Catheter ablation is a medical procedure used to treat some cardiac arrhythmias (irregular heartbeats). During catheter ablation, a long, thin, flexible tube is put into a blood vessel in your groin (upper thigh), or neck. This tube is called an ablation catheter. It is then guided to your heart through the blood vessel. Radio frequency waves destroy small areas of heart tissue where abnormal heartbeats may cause an arrhythmia to start. Please see the instruction sheet given to you today.---05/02/15  Please check in on 05/02/15 at the [facility] at 5:30am.  Do not eat or drink after midnight and do not take any medications the morning of your procedure   Follow-Up: Your physician recommends that you schedule a follow-up 4 weeks from 05/02/15 with Dr Lovena Le   Any Other Special Instructions Will Be Listed Below (If Applicable).     If you need a refill on your cardiac medications before your next appointment, please call your pharmacy.

## 2015-04-04 NOTE — Assessment & Plan Note (Signed)
I have recommended he undergo sleep evaluation for snoring to see if he has sleep disordered breathing.

## 2015-04-04 NOTE — Progress Notes (Signed)
      HPI Mr. Edge returns today after several months. He is a pleasant 54 yo man with palpitations and SVT which was found to be due to atrial flutter with a RVR. His initial presentation was most likely 1:1 atrial flutter but he represented back in August with 2:1 atrial flutter. He has been fairly stable in the interim. He has a CHADSVASC score of 0. He does note that he snores at night. Never a diagnosis or eval for sleep apnea. No Known Allergies   Current Outpatient Prescriptions  Medication Sig Dispense Refill  . aspirin 325 MG tablet Take 325 mg by mouth daily.    Marland Kitchen diltiazem (CARDIZEM) 30 MG tablet Take 1 tablet (30 mg total) by mouth 2 (two) times daily as needed (as needed for palpitations). 30 tablet 0  . naproxen sodium (ANAPROX) 220 MG tablet Take 220-440 mg by mouth 2 (two) times daily as needed (pain).    . sildenafil (VIAGRA) 50 MG tablet Take 1 tablet (50 mg total) by mouth daily as needed for erectile dysfunction. 25 tablet 1   No current facility-administered medications for this visit.     Past Medical History  Diagnosis Date  . PSVT (paroxysmal supraventricular tachycardia) ([place])   . GERD (gastroesophageal reflux disease)     ROS:   All systems reviewed and negative except as noted in the HPI.   Past Surgical History  Procedure Laterality Date  . Right toe surgery       Family History  Problem Relation Age of Onset  . Aneurysm Mother 55    brain  . Congestive Heart Failure Father   . Diabetes Father      Social History   Social History  . Marital Status: Divorced    Spouse Name: N/A  . Number of Children: N/A  . Years of Education: N/A   Occupational History  . lawn care    Social History Main Topics  . Smoking status: Never Smoker   . Smokeless tobacco: Not on file  . Alcohol Use: Yes  . Drug Use: No  . Sexual Activity: Not on file   Other Topics Concern  . Not on file   Social History Narrative     BP 110/82 mmHg   Pulse 82  Ht 6\' 2"  (1.88 m)  Wt 257 lb (116.574 kg)  BMI 32.98 kg/m2  Physical Exam:  Well appearing middle aged man, NAD HEENT: Unremarkable Neck:  No JVD, no thyromegally Lymphatics:  No adenopathy Back:  No CVA tenderness Lungs:  Clear with no wheezes HEART:  Regular rate rhythm, no murmurs, no rubs, no clicks Abd:  soft, positive bowel sounds, no organomegally, no rebound, no guarding Ext:  2 plus pulses, no edema, no cyanosis, no clubbing Skin:  No rashes no nodules Neuro:  CN II through XII intact, motor grossly intact  EKG - NSR  Assess/Plan:

## 2015-04-04 NOTE — Assessment & Plan Note (Signed)
It is unclear as to whether or not he has HTN. He states that his blood pressure is normal at home.

## 2015-04-24 ENCOUNTER — Other Ambulatory Visit (INDEPENDENT_AMBULATORY_CARE_PROVIDER_SITE_OTHER): Payer: BLUE CROSS/BLUE SHIELD | Admitting: *Deleted

## 2015-04-24 DIAGNOSIS — I4892 Unspecified atrial flutter: Secondary | ICD-10-CM

## 2015-04-24 LAB — BASIC METABOLIC PANEL
BUN: 20 mg/dL (ref 7–25)
CALCIUM: 8.6 mg/dL (ref 8.6–10.3)
CO2: 26 mmol/L (ref 20–31)
CREATININE: 0.88 mg/dL (ref 0.70–1.33)
Chloride: 107 mmol/L (ref 98–110)
GLUCOSE: 94 mg/dL (ref 65–99)
Potassium: 4.3 mmol/L (ref 3.5–5.3)
Sodium: 139 mmol/L (ref 135–146)

## 2015-04-24 LAB — CBC WITH DIFFERENTIAL/PLATELET
BASOS PCT: 0 % (ref 0–1)
Basophils Absolute: 0 10*3/uL (ref 0.0–0.1)
EOS ABS: 0.1 10*3/uL (ref 0.0–0.7)
EOS PCT: 1 % (ref 0–5)
HCT: 43.2 % (ref 39.0–52.0)
HEMOGLOBIN: 14.9 g/dL (ref 13.0–17.0)
Lymphocytes Relative: 45 % (ref 12–46)
Lymphs Abs: 2.6 10*3/uL (ref 0.7–4.0)
MCH: 30.2 pg (ref 26.0–34.0)
MCHC: 34.5 g/dL (ref 30.0–36.0)
MCV: 87.4 fL (ref 78.0–100.0)
MONO ABS: 0.6 10*3/uL (ref 0.1–1.0)
MONOS PCT: 11 % (ref 3–12)
MPV: 10.9 fL (ref 8.6–12.4)
NEUTROS ABS: 2.5 10*3/uL (ref 1.7–7.7)
Neutrophils Relative %: 43 % (ref 43–77)
PLATELETS: 216 10*3/uL (ref 150–400)
RBC: 4.94 MIL/uL (ref 4.22–5.81)
RDW: 12.9 % (ref 11.5–15.5)
WBC: 5.7 10*3/uL (ref 4.0–10.5)

## 2015-04-25 ENCOUNTER — Other Ambulatory Visit: Payer: BLUE CROSS/BLUE SHIELD

## 2015-05-02 ENCOUNTER — Ambulatory Visit (HOSPITAL_COMMUNITY)
Admission: RE | Admit: 2015-05-02 | Discharge: 2015-05-02 | Disposition: A | Discharge status: Home / Self Care | Payer: BLUE CROSS/BLUE SHIELD | Source: Ambulatory Visit | Attending: Internal Medicine | Admitting: Internal Medicine

## 2015-05-02 ENCOUNTER — Encounter (HOSPITAL_COMMUNITY)
Admission: RE | Disposition: A | Discharge status: Home / Self Care | Payer: Self-pay | Source: Ambulatory Visit | Attending: Internal Medicine

## 2015-05-02 ENCOUNTER — Encounter (HOSPITAL_COMMUNITY): Payer: Self-pay | Admitting: Internal Medicine

## 2015-05-02 DIAGNOSIS — I483 Typical atrial flutter: Secondary | ICD-10-CM | POA: Insufficient documentation

## 2015-05-02 DIAGNOSIS — I4892 Unspecified atrial flutter: Secondary | ICD-10-CM | POA: Diagnosis present

## 2015-05-02 DIAGNOSIS — Z7982 Long term (current) use of aspirin: Secondary | ICD-10-CM | POA: Insufficient documentation

## 2015-05-02 DIAGNOSIS — I471 Supraventricular tachycardia: Secondary | ICD-10-CM | POA: Diagnosis present

## 2015-05-02 HISTORY — PX: ELECTROPHYSIOLOGIC STUDY: SHX172A

## 2015-05-02 SURGERY — A-FLUTTER/A-TACH/SVT ABLATION

## 2015-05-02 MED ORDER — MIDAZOLAM HCL 5 MG/5ML IJ SOLN
INTRAMUSCULAR | Status: AC
  Filled 2015-05-02: qty 5

## 2015-05-02 MED ORDER — BUPIVACAINE HCL (PF) 0.25 % IJ SOLN
INTRAMUSCULAR | Status: AC
  Filled 2015-05-02: qty 60

## 2015-05-02 MED ORDER — FENTANYL CITRATE (PF) 100 MCG/2ML IJ SOLN: 25.0000 ug | INTRAMUSCULAR | Status: DC | PRN

## 2015-05-02 MED ORDER — FENTANYL CITRATE (PF) 100 MCG/2ML IJ SOLN
INTRAMUSCULAR | Status: DC | PRN
  Administered 2015-05-02: 12.5 ug via INTRAVENOUS
  Administered 2015-05-02 (×4): 25 ug via INTRAVENOUS
  Administered 2015-05-02: 12.5 ug via INTRAVENOUS
  Administered 2015-05-02 (×2): 25 ug via INTRAVENOUS

## 2015-05-02 MED ORDER — FENTANYL CITRATE (PF) 100 MCG/2ML IJ SOLN
INTRAMUSCULAR | Status: AC
  Filled 2015-05-02: qty 2

## 2015-05-02 MED ORDER — DILTIAZEM HCL 30 MG PO TABS: 30.0000 mg | ORAL_TABLET | Freq: Two times a day (BID) | ORAL | Status: DC | PRN

## 2015-05-02 MED ORDER — MIDAZOLAM HCL 5 MG/5ML IJ SOLN
INTRAMUSCULAR | Status: DC | PRN
  Administered 2015-05-02 (×2): 2 mg via INTRAVENOUS
  Administered 2015-05-02 (×2): 1 mg via INTRAVENOUS
  Administered 2015-05-02 (×2): 2 mg via INTRAVENOUS
  Administered 2015-05-02: 1 mg via INTRAVENOUS
  Administered 2015-05-02: 2 mg via INTRAVENOUS

## 2015-05-02 MED ORDER — SODIUM CHLORIDE 0.9 % IV SOLN
1.0000 mg | INTRAVENOUS | Status: DC | PRN
  Administered 2015-05-02: 4 ug/min via INTRAVENOUS

## 2015-05-02 MED ORDER — ACETAMINOPHEN 325 MG PO TABS: 650.0000 mg | ORAL_TABLET | ORAL | Status: DC | PRN

## 2015-05-02 MED ORDER — SODIUM CHLORIDE 0.9 % IJ SOLN: 3.0000 mL | Freq: Two times a day (BID) | INTRAMUSCULAR | Status: DC

## 2015-05-02 MED ORDER — SODIUM CHLORIDE 0.9 % IV SOLN: 250.0000 mL | INTRAVENOUS | Status: DC | PRN

## 2015-05-02 MED ORDER — HEPARIN (PORCINE) IN NACL 2-0.9 UNIT/ML-% IJ SOLN
INTRAMUSCULAR | Status: DC | PRN
  Administered 2015-05-02: 08:00:00

## 2015-05-02 MED ORDER — ONDANSETRON HCL 4 MG/2ML IJ SOLN: 4.0000 mg | Freq: Four times a day (QID) | INTRAMUSCULAR | Status: DC | PRN

## 2015-05-02 MED ORDER — ASPIRIN 325 MG PO TABS
325.0000 mg | ORAL_TABLET | Freq: Every day | ORAL | Status: DC
  Administered 2015-05-02: 325 mg via ORAL
  Filled 2015-05-02: qty 1

## 2015-05-02 MED ORDER — BUPIVACAINE HCL (PF) 0.25 % IJ SOLN
INTRAMUSCULAR | Status: DC | PRN
  Administered 2015-05-02: 31 mL

## 2015-05-02 MED ORDER — HEPARIN (PORCINE) IN NACL 2-0.9 UNIT/ML-% IJ SOLN
INTRAMUSCULAR | Status: AC
  Filled 2015-05-02: qty 500

## 2015-05-02 MED ORDER — SODIUM CHLORIDE 0.9 % IJ SOLN: 3.0000 mL | INTRAMUSCULAR | Status: DC | PRN

## 2015-05-02 MED ORDER — ISOPROTERENOL HCL 0.2 MG/ML IJ SOLN
INTRAMUSCULAR | Status: AC
  Filled 2015-05-02: qty 5

## 2015-05-02 MED ORDER — NAPROXEN 250 MG PO TABS: 250.0000 mg | ORAL_TABLET | Freq: Two times a day (BID) | ORAL | Status: DC | PRN

## 2015-05-02 SURGICAL SUPPLY — 11 items
BAG SNAP BAND KOVER 36X36 (MISCELLANEOUS) ×1 IMPLANT
CATH CELSIUS FLTR 8MM (ABLATOR) ×1 IMPLANT
CATH JOSEPHSON QUAD-ALLRED 6FR (CATHETERS) ×2 IMPLANT
CATH POLARIS X 2.5/5/2.5 DECAP (CATHETERS) ×1 IMPLANT
PACK EP LATEX FREE (CUSTOM PROCEDURE TRAY) ×2
PACK EP LF (CUSTOM PROCEDURE TRAY) IMPLANT
PAD DEFIB LIFELINK (PAD) ×1 IMPLANT
SHEATH PINNACLE 6F 10CM (SHEATH) ×2 IMPLANT
SHEATH PINNACLE 7F 10CM (SHEATH) ×1 IMPLANT
SHEATH PINNACLE 8F 10CM (SHEATH) ×1 IMPLANT
SHIELD RADPAD SCOOP 12X17 (MISCELLANEOUS) ×1 IMPLANT

## 2015-05-02 NOTE — Interval H&P Note (Signed)
History and Physical Interval Note:  05/02/2015 7:11 AM  [name]  has presented today for surgery, with the diagnosis of aflutter  The various methods of treatment have been discussed with the patient and family. After consideration of risks, benefits and other options for treatment, the patient has consented to  Procedure(s): A-Flutter Ablation (N/A) as a surgical intervention .  The patient's history has been reviewed, patient examined, no change in status, stable for surgery.  I have reviewed the patient's chart and labs.  Questions were answered to the patient's satisfaction.     [name]

## 2015-05-02 NOTE — Discharge Instructions (Signed)
No driving for 4 days. No lifting over 5 lbs for 1 week. No sexual activity for 1 week. You may return to work on 05/09/15. Keep procedure site clean & dry. If you notice increased pain, swelling, bleeding or pus, call/return!  You may shower, but no soaking baths/hot tubs/pools for 1 week.

## 2015-05-02 NOTE — Progress Notes (Signed)
Site area: rfv x 3 Site Prior to Removal:  Level 0 Pressure Applied For: Manual:    20 min Patient Status During Pull:  stable Post Pull Site:  Level 0 Post Pull Instructions Given:yes Post Pull Pulses Present: palpable Dressing Applied:  clear Bedrest begins @ 1040 till 1640 Comments:

## 2015-05-02 NOTE — H&P (View-Only) (Signed)
      HPI [name] returns today after several months. He is a pleasant 54 yo man with palpitations and SVT which was found to be due to atrial flutter with a RVR. His initial presentation was most likely 1:1 atrial flutter but he represented back in August with 2:1 atrial flutter. He has been fairly stable in the interim. He has a CHADSVASC score of 0. He does note that he snores at night. Never a diagnosis or eval for sleep apnea. No Known Allergies   Current Outpatient Prescriptions  Medication Sig Dispense Refill  . aspirin 325 MG tablet Take 325 mg by mouth daily.    Marland Kitchen diltiazem (CARDIZEM) 30 MG tablet Take 1 tablet (30 mg total) by mouth 2 (two) times daily as needed (as needed for palpitations). 30 tablet 0  . naproxen sodium (ANAPROX) 220 MG tablet Take 220-440 mg by mouth 2 (two) times daily as needed (pain).    . sildenafil (VIAGRA) 50 MG tablet Take 1 tablet (50 mg total) by mouth daily as needed for erectile dysfunction. 25 tablet 1   No current facility-administered medications for this visit.     Past Medical History  Diagnosis Date  . PSVT (paroxysmal supraventricular tachycardia) ([place])   . GERD (gastroesophageal reflux disease)     ROS:   All systems reviewed and negative except as noted in the HPI.   Past Surgical History  Procedure Laterality Date  . Right toe surgery       Family History  Problem Relation Age of Onset  . Aneurysm Mother 70    brain  . Congestive Heart Failure Father   . Diabetes Father      Social History   Social History  . Marital Status: Divorced    Spouse Name: N/A  . Number of Children: N/A  . Years of Education: N/A   Occupational History  . lawn care    Social History Main Topics  . Smoking status: Never Smoker   . Smokeless tobacco: Not on file  . Alcohol Use: Yes  . Drug Use: No  . Sexual Activity: Not on file   Other Topics Concern  . Not on file   Social History Narrative     BP 110/82 mmHg   Pulse 82  Ht 6\' 2"  (1.88 m)  Wt 257 lb (116.574 kg)  BMI 32.98 kg/m2  Physical Exam:  Well appearing middle aged man, NAD HEENT: Unremarkable Neck:  No JVD, no thyromegally Lymphatics:  No adenopathy Back:  No CVA tenderness Lungs:  Clear with no wheezes HEART:  Regular rate rhythm, no murmurs, no rubs, no clicks Abd:  soft, positive bowel sounds, no organomegally, no rebound, no guarding Ext:  2 plus pulses, no edema, no cyanosis, no clubbing Skin:  No rashes no nodules Neuro:  CN II through XII intact, motor grossly intact  EKG - NSR  Assess/Plan:

## 2015-05-02 NOTE — Progress Notes (Signed)
Pt is stable. Pt has been educated on the importance of following up with the doctor. Pt has agreed to take medication as prescribed and to follow up with physician. Telemetry has been discontinued.

## 2015-05-31 ENCOUNTER — Ambulatory Visit (INDEPENDENT_AMBULATORY_CARE_PROVIDER_SITE_OTHER): Payer: BLUE CROSS/BLUE SHIELD | Admitting: Internal Medicine

## 2015-05-31 ENCOUNTER — Encounter: Payer: Self-pay | Admitting: Internal Medicine

## 2015-05-31 VITALS — BP 120/84 | HR 61 | Ht 74.0 in | Wt 262.2 lb

## 2015-05-31 DIAGNOSIS — I471 Supraventricular tachycardia: Secondary | ICD-10-CM

## 2015-05-31 DIAGNOSIS — I4892 Unspecified atrial flutter: Secondary | ICD-10-CM

## 2015-05-31 NOTE — Assessment & Plan Note (Signed)
He is s/p ablation and doing well. Will undergo watchful waiting.

## 2015-05-31 NOTE — Progress Notes (Signed)
      HPI [name] returns today after several months. He is a pleasant 55 yo man with palpitations and SVT which was found to be due to atrial flutter with a RVR. His initial presentation was most likely 1:1 atrial flutter but he represented back in August with 2:1 atrial flutter. He has been fairly stable in the interim. He has a CHADSVASC score of 0. He does note that he snores at night. He has undergone catheter ablation where he has had non-sustained atrial flutter and also had PR greater than RR and has done well in the interim. No other complaints.  No Known Allergies   Current Outpatient Prescriptions  Medication Sig Dispense Refill  . aspirin 325 MG tablet Take 325 mg by mouth daily.    Marland Kitchen diltiazem (CARDIZEM) 30 MG tablet Take 1 tablet (30 mg total) by mouth 2 (two) times daily as needed (as needed for palpitations). 30 tablet 0  . sildenafil (VIAGRA) 50 MG tablet Take 1 tablet (50 mg total) by mouth daily as needed for erectile dysfunction. 25 tablet 1   No current facility-administered medications for this visit.     Past Medical History  Diagnosis Date  . PSVT (paroxysmal supraventricular tachycardia) ([place])   . GERD (gastroesophageal reflux disease)     ROS:   All systems reviewed and negative except as noted in the HPI.   Past Surgical History  Procedure Laterality Date  . Right toe surgery    . Electrophysiologic study N/A 05/02/2015    Procedure: A-Flutter Ablation;  Surgeon: Evans Lance, MD;  Location: [facility];  Service: Cardiovascular;  Laterality: N/A;     Family History  Problem Relation Age of Onset  . Aneurysm Mother 68    brain  . Congestive Heart Failure Father   . Diabetes Father      Social History   Social History  . Marital Status: Divorced    Spouse Name: N/A  . Number of Children: N/A  . Years of Education: N/A   Occupational History  . lawn care    Social History Main Topics  . Smoking status: Never Smoker   .  Smokeless tobacco: Not on file  . Alcohol Use: Yes  . Drug Use: No  . Sexual Activity: Not on file   Other Topics Concern  . Not on file   Social History Narrative     BP 120/84 mmHg  Pulse 61  Ht 6\' 2"  (1.88 m)  Wt 262 lb 3.2 oz (118.933 kg)  BMI 33.65 kg/m2  Physical Exam:  Well appearing middle aged man, NAD HEENT: Unremarkable Neck:  6 cm JVD, no thyromegally Lymphatics:  No adenopathy Back:  No CVA tenderness Lungs:  Clear with no wheezes HEART:  Regular rate rhythm, no murmurs, no rubs, no clicks Abd:  soft, positive bowel sounds, no organomegally, no rebound, no guarding Ext:  2 plus pulses, no edema, no cyanosis, no clubbing Skin:  No rashes no nodules Neuro:  CN II through XII intact, motor grossly intact  EKG - NSR  Assess/Plan:

## 2015-05-31 NOTE — Patient Instructions (Signed)
Medication Instructions:  Your physician recommends that you continue on your current medications as directed. Please refer to the Current Medication list given to you today.  Labwork: None ordered  Testing/Procedures: None ordered  Follow-Up: No follow up is needed at this time with Dr. Lovena Le.  He will see you on an as needed basis.  If you need a refill on your cardiac medications before your next appointment, please call your pharmacy.  Thank you for choosing [facility]!!

## 2015-05-31 NOTE — Assessment & Plan Note (Signed)
He has had no recurrent SVT, s/p slow pathway modification. Will follow.

## 2015-06-01 ENCOUNTER — Encounter (HOSPITAL_BASED_OUTPATIENT_CLINIC_OR_DEPARTMENT_OTHER): Payer: BLUE CROSS/BLUE SHIELD

## 2016-11-10 IMAGING — DX DG CHEST 1V PORT
1 series · 1 of 1 positions shown · non-contrast
Comparison: Chest radiograph from 01/16/2011

CLINICAL DATA: Acute onset of heart palpitations. Initial
encounter.

EXAM:
PORTABLE CHEST - 1 VIEW

[chest ap]
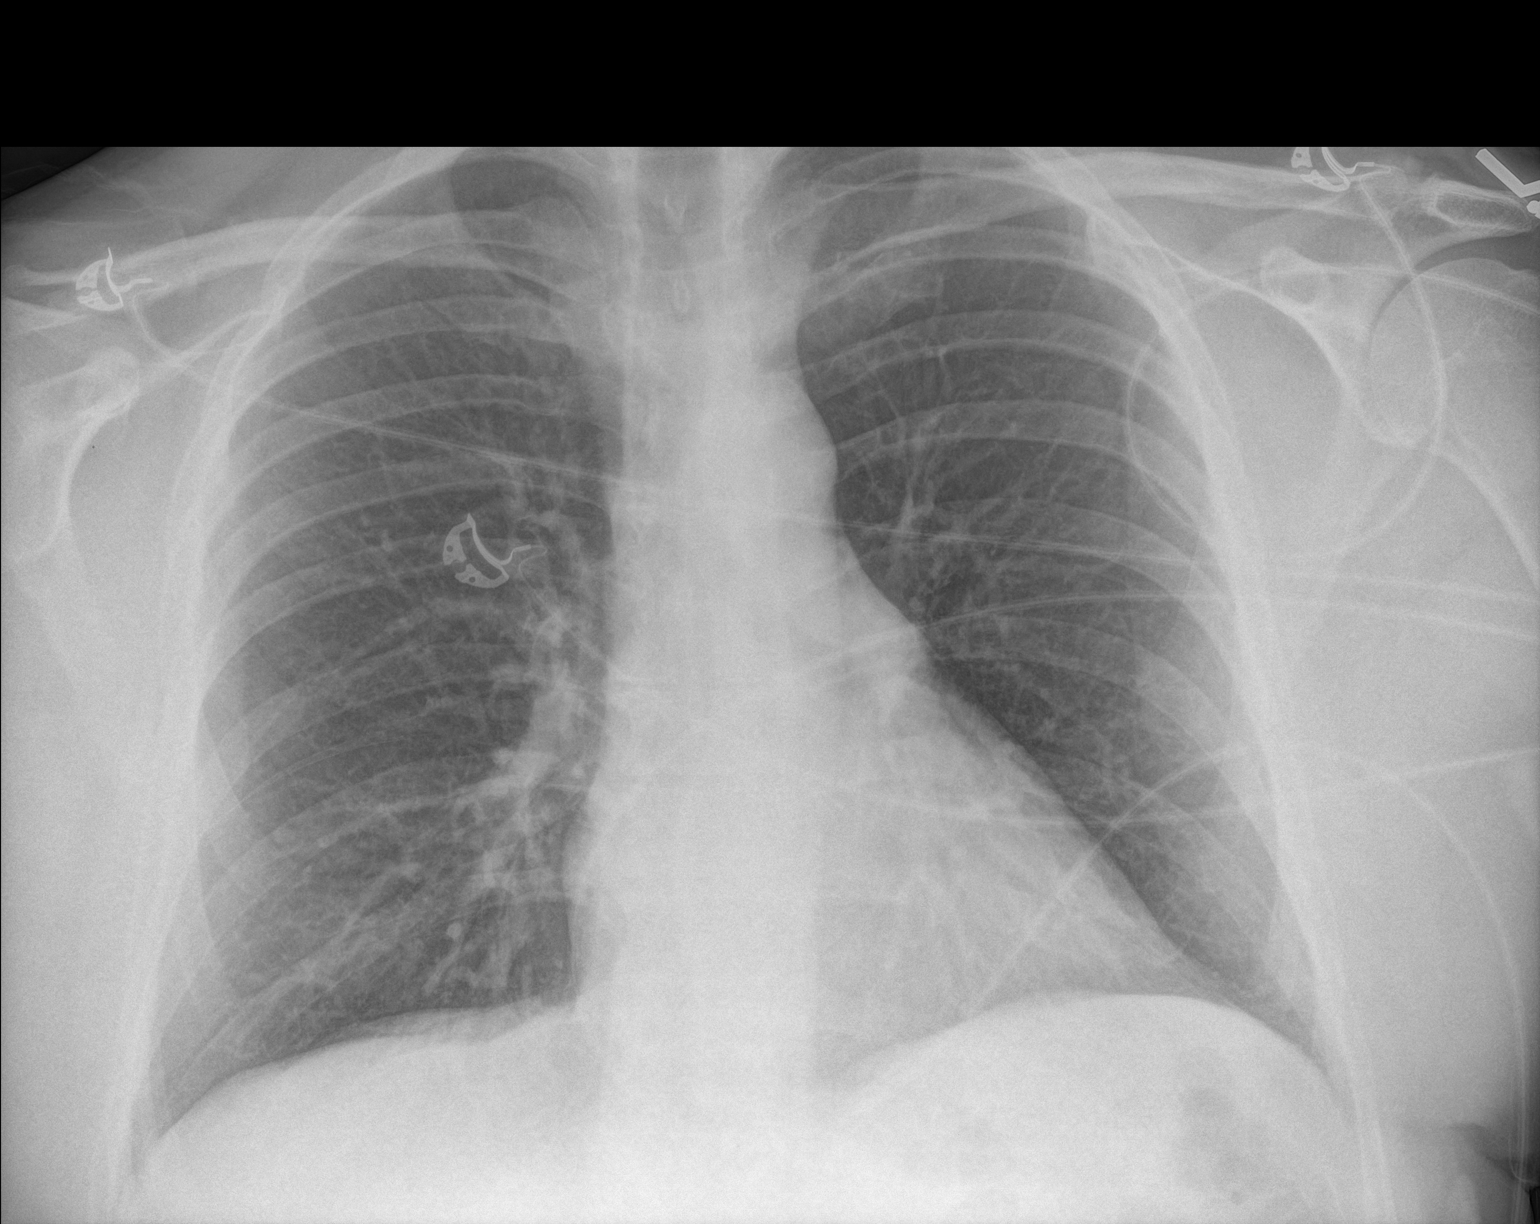

[1 of 1 positions shown; findings below may reference images not displayed]

FINDINGS: The lungs are well-aerated and clear. There is no evidence of focal
opacification, pleural effusion or pneumothorax.

The cardiomediastinal silhouette is within normal limits. No acute
osseous abnormalities are seen.
IMPRESSION: No acute cardiopulmonary process seen.

## 2016-12-31 DIAGNOSIS — D225 Melanocytic nevi of trunk: Secondary | ICD-10-CM | POA: Diagnosis not present

## 2016-12-31 DIAGNOSIS — L821 Other seborrheic keratosis: Secondary | ICD-10-CM | POA: Diagnosis not present

## 2016-12-31 DIAGNOSIS — D2262 Melanocytic nevi of left upper limb, including shoulder: Secondary | ICD-10-CM | POA: Diagnosis not present

## 2016-12-31 DIAGNOSIS — L570 Actinic keratosis: Secondary | ICD-10-CM | POA: Diagnosis not present

## 2016-12-31 DIAGNOSIS — D2261 Melanocytic nevi of right upper limb, including shoulder: Secondary | ICD-10-CM | POA: Diagnosis not present

## 2017-01-23 ENCOUNTER — Emergency Department (HOSPITAL_BASED_OUTPATIENT_CLINIC_OR_DEPARTMENT_OTHER)
Admission: EM | Admit: 2017-01-23 | Discharge: 2017-01-23 | Disposition: A | Discharge status: Home / Self Care | Payer: BLUE CROSS/BLUE SHIELD | Attending: Emergency Medicine | Admitting: Emergency Medicine

## 2017-01-23 ENCOUNTER — Encounter (HOSPITAL_BASED_OUTPATIENT_CLINIC_OR_DEPARTMENT_OTHER): Payer: Self-pay | Admitting: Emergency Medicine

## 2017-01-23 DIAGNOSIS — R252 Cramp and spasm: Secondary | ICD-10-CM | POA: Diagnosis not present

## 2017-01-23 DIAGNOSIS — I10 Essential (primary) hypertension: Secondary | ICD-10-CM | POA: Insufficient documentation

## 2017-01-23 DIAGNOSIS — Z79899 Other long term (current) drug therapy: Secondary | ICD-10-CM | POA: Diagnosis not present

## 2017-01-23 DIAGNOSIS — Z7982 Long term (current) use of aspirin: Secondary | ICD-10-CM | POA: Diagnosis not present

## 2017-01-23 LAB — CK: Total CK: 326 U/L (ref 49–397)

## 2017-01-23 LAB — CBC WITH DIFFERENTIAL/PLATELET
BASOS ABS: 0 10*3/uL (ref 0.0–0.1)
BASOS PCT: 0 %
Eosinophils Absolute: 0.1 10*3/uL (ref 0.0–0.7)
Eosinophils Relative: 1 %
HEMATOCRIT: 42.9 % (ref 39.0–52.0)
HEMOGLOBIN: 15.6 g/dL (ref 13.0–17.0)
Lymphocytes Relative: 24 %
Lymphs Abs: 2.5 10*3/uL (ref 0.7–4.0)
MCH: 30.5 pg (ref 26.0–34.0)
MCHC: 36.4 g/dL — ABNORMAL HIGH (ref 30.0–36.0)
MCV: 84 fL (ref 78.0–100.0)
MONOS PCT: 8 %
Monocytes Absolute: 0.9 10*3/uL (ref 0.1–1.0)
NEUTROS ABS: 7.1 10*3/uL (ref 1.7–7.7)
NEUTROS PCT: 67 %
Platelets: 229 10*3/uL (ref 150–400)
RBC: 5.11 MIL/uL (ref 4.22–5.81)
RDW: 12.2 % (ref 11.5–15.5)
WBC: 10.7 10*3/uL — ABNORMAL HIGH (ref 4.0–10.5)

## 2017-01-23 LAB — COMPREHENSIVE METABOLIC PANEL
ALK PHOS: 77 U/L (ref 38–126)
ALT: 34 U/L (ref 17–63)
ANION GAP: 11 (ref 5–15)
AST: 30 U/L (ref 15–41)
Albumin: 4.6 g/dL (ref 3.5–5.0)
BUN: 26 mg/dL — ABNORMAL HIGH (ref 6–20)
CALCIUM: 9.5 mg/dL (ref 8.9–10.3)
CO2: 22 mmol/L (ref 22–32)
Chloride: 103 mmol/L (ref 101–111)
Creatinine, Ser: 0.89 mg/dL (ref 0.61–1.24)
GFR calc Af Amer: >60 mL/min (ref >60)
GFR calc non Af Amer: >60 mL/min (ref >60)
Glucose, Bld: 108 mg/dL — ABNORMAL HIGH (ref 65–99)
Potassium: 3.8 mmol/L (ref 3.5–5.1)
SODIUM: 136 mmol/L (ref 135–145)
TOTAL PROTEIN: 8.1 g/dL (ref 6.5–8.1)
Total Bilirubin: 0.8 mg/dL (ref 0.3–1.2)

## 2017-01-23 LAB — URINALYSIS, ROUTINE W REFLEX MICROSCOPIC
Bilirubin Urine: NEGATIVE
Glucose, UA: NEGATIVE mg/dL
Hgb urine dipstick: NEGATIVE
Ketones, ur: NEGATIVE mg/dL
Leukocytes, UA: NEGATIVE
NITRITE: NEGATIVE
Protein, ur: NEGATIVE mg/dL
pH: 6 (ref 5.0–8.0)

## 2017-01-23 MED ORDER — CYCLOBENZAPRINE HCL 10 MG PO TABS: 10.0000 mg | ORAL_TABLET | Freq: Two times a day (BID) | ORAL | 0 refills | Status: DC | PRN

## 2017-01-23 MED ORDER — SODIUM CHLORIDE 0.9 % IV BOLUS (SEPSIS)
1000.0000 mL | Freq: Once | INTRAVENOUS | Status: AC
  Administered 2017-01-23: 1000 mL via INTRAVENOUS

## 2017-01-23 NOTE — ED Notes (Signed)
ED Provider at bedside. (pa student)

## 2017-01-23 NOTE — ED Triage Notes (Signed)
Pt reports cramping to entire body. States he has been working outside in the heat, drinking plenty of fluid. Pt states he has recurring cramping every summer and has been tested for multiples things but not diagnosis given other than dehydration.

## 2017-01-23 NOTE — ED Provider Notes (Signed)
[place] [facility] Provider Note   CSN: [id number] Arrival date & time: 01/23/17  1500     History   Chief Complaint Chief Complaint  Patient presents with  . Cramping    HPI [name] is a 56 y.o. male.  HPI   56 year old male with hx of PSVT, HTN, a-flutter s/p cardiac ablation here with body cramping.  For the past year pt has recurrent body cramping however today his cramping is more intense and affect mostly his back, arms, abdomen.  Have been working on some equipment outside for several hrs under the sun.  Report cramping became intense.  Wife brought him here.  sts he tries to stay hydrated.  Only thing to help with cramp is by moving.  Pt is a social drinker. No hx of alcohol abuse, NSAIDS or tylenol use.  Denies illicit drug use.  Does report increase urination but admits to drinking plenty of fluid.  Denies n/v/d, abnormal weight changes.  While resting sxs has since improved. No medication changes and does not take statin medication.  Report cramping sometimes wakes him up at night.  sxs also present after over exertion.  Denies hx of cardiac disease or exertional chest pain.    Past Medical History:  Diagnosis Date  . GERD (gastroesophageal reflux disease)   . PSVT (paroxysmal supraventricular tachycardia) [facility])     Patient Active Problem List   Diagnosis Date Noted  . Atrial flutter ([place]) 05/02/2015  . Atrial flutter with rapid ventricular response ([place]) 12/23/2014  . Palpitations 12/23/2014  . Snoring   . Erectile dysfunction 05/13/2013  . Chest pain 03/05/2011  . Shortness of breath 03/05/2011  . PSVT (paroxysmal supraventricular tachycardia) ([place]) 02/05/2011  . Hypertension 02/05/2011    Past Surgical History:  Procedure Laterality Date  . ELECTROPHYSIOLOGIC STUDY N/A 05/02/2015   Procedure: A-Flutter Ablation;  Surgeon: Evans Lance, MD;  Location: [place] [facility];  Service: Cardiovascular;  Laterality: N/A;  . right toe surgery          Home Medications    Prior to Admission medications   Medication Sig Start Date End Date Taking? Authorizing Provider  aspirin 325 MG tablet Take 325 mg by mouth daily.    [provider]  diltiazem (CARDIZEM) 30 MG tablet Take 1 tablet (30 mg total) by mouth 2 (two) times daily as needed (as needed for palpitations). 12/23/14   Kinnie Feil, MD  sildenafil (VIAGRA) 50 MG tablet Take 1 tablet (50 mg total) by mouth daily as needed for erectile dysfunction. 10/21/14   Evans Lance, MD    Family History Family History  Problem Relation Age of Onset  . Aneurysm Mother 38       brain  . Congestive Heart Failure Father   . Diabetes Father     Social History Social History  Substance Use Topics  . Smoking status: Never Smoker  . Smokeless tobacco: Never Used  . Alcohol use Yes     Allergies   Patient has no known allergies.   Review of Systems Review of Systems  All other systems reviewed and are negative.    Physical Exam Updated Vital Signs BP (!) 134/92 (BP Location: Right Arm)   Pulse 100   Temp 98.9 F (37.2 C) (Oral)   Resp 18   Ht 6\' 2"  (1.88 m)   Wt 113.4 kg (250 lb)   SpO2 100%   BMI 32.10 kg/m   Physical Exam  Constitutional: He is oriented to person, place, and time. He appears well-developed and well-nourished. No distress.  HENT:  Head: Atraumatic.  Mouth/Throat: Oropharynx is clear and moist.  Eyes: Conjunctivae are normal.  Neck: Neck supple.  Cardiovascular: Normal rate and regular rhythm.   Pulmonary/Chest: Effort normal and breath sounds normal. He has no wheezes.  Abdominal: Soft. Bowel sounds are normal. He exhibits no distension. There is no tenderness.  Musculoskeletal:  5/5 strength to all 4 extremities with intact distal pulses  Neurological: He is alert and oriented to person, place, and time.  Skin: Skin is warm. No rash noted.  Psychiatric: He has a normal mood and affect.  Nursing note and vitals  reviewed.    ED Treatments / Results  Labs (all labs ordered are listed, but only abnormal results are displayed) Labs Reviewed  URINALYSIS, ROUTINE W REFLEX MICROSCOPIC - Abnormal; Notable for the following:       Result Value   Specific Gravity, Urine <1.005 (*)    All other components within normal limits  COMPREHENSIVE METABOLIC PANEL  CBC WITH DIFFERENTIAL/PLATELET  CK    EKG  EKG Interpretation None       Radiology No results found.  Procedures Procedures (including critical care time)  Medications Ordered in ED Medications - No data to display   Initial Impression / Assessment and Plan / ED Course  I have reviewed the triage vital signs and the nursing notes.  Pertinent labs & imaging results that were available during my care of the patient were reviewed by me and considered in my medical decision making (see chart for details).     BP (!) 117/99 (BP Location: Left Arm)   Pulse 76   Temp 98.9 F (37.2 C) (Oral)   Resp 16   Ht 6\' 2"  (1.88 m)   Wt 113.4 kg (250 lb)   SpO2 98%   BMI 32.10 kg/m    Final Clinical Impressions(s) / ED Diagnoses   Final diagnoses:  Cramps, muscle, general    New Prescriptions New Prescriptions   CYCLOBENZAPRINE (FLEXERIL) 10 MG TABLET    Take 1 tablet (10 mg total) by mouth 2 (two) times daily as needed for muscle spasms.   5:58 PM Pt here with recurrent body cramps which has been an ongoing issue x 1 year, worse today after working out in the heat.  He is well appearing, vss.  No concerning skin changes.  sxs not suggestive of cardiac etiology.  Will check for any electrolytes derangement or rhabdo.  IVF given.    8:13 PM Labs are reassuring.  Pt felt better after receiving IVF.  He is stable for discharge.  Return precaution given.    Domenic Moras, PA-C 01/23/17 2014    Fredia Sorrow, MD 01/25/17 [phone]

## 2017-05-21 DIAGNOSIS — Z8679 Personal history of other diseases of the circulatory system: Secondary | ICD-10-CM | POA: Insufficient documentation

## 2017-05-21 DIAGNOSIS — R300 Dysuria: Secondary | ICD-10-CM | POA: Diagnosis not present

## 2017-06-30 DIAGNOSIS — L570 Actinic keratosis: Secondary | ICD-10-CM | POA: Diagnosis not present

## 2017-07-03 DIAGNOSIS — M25571 Pain in right ankle and joints of right foot: Secondary | ICD-10-CM | POA: Diagnosis not present

## 2017-07-03 DIAGNOSIS — M19071 Primary osteoarthritis, right ankle and foot: Secondary | ICD-10-CM | POA: Diagnosis not present

## 2017-07-03 DIAGNOSIS — M79671 Pain in right foot: Secondary | ICD-10-CM | POA: Diagnosis not present

## 2017-07-07 DIAGNOSIS — M12271 Villonodular synovitis (pigmented), right ankle and foot: Secondary | ICD-10-CM | POA: Diagnosis not present

## 2017-08-08 DIAGNOSIS — M12271 Villonodular synovitis (pigmented), right ankle and foot: Secondary | ICD-10-CM | POA: Diagnosis not present

## 2017-10-03 DIAGNOSIS — R0982 Postnasal drip: Secondary | ICD-10-CM | POA: Diagnosis not present

## 2017-10-03 DIAGNOSIS — J069 Acute upper respiratory infection, unspecified: Secondary | ICD-10-CM | POA: Diagnosis not present

## 2017-11-10 ENCOUNTER — Encounter: Payer: Self-pay | Admitting: Physician Assistant

## 2017-11-10 NOTE — Progress Notes (Signed)
Cardiology Office Note    Date:  11/11/2017  ID:  [name], DOB [birth year], MRN [id number] PCP:  [name], MD  Cardiologist:  [name], MD   Chief Complaint: chest cramping  History of Present Illness:  [name] is a 57 y.o. male with history of atrial flutter, SVT, GERD, obesity who presents for follow-up. Last seen by Dr. Lovena Le 05/2015 who reports that initial presentation was probably 1:1 flutter but also had 2:1 flutter. He underwent ablation 2016. 2D echo 12/2014 showed mild LVH, EF 50%, aortic root upper normal, mildly dilated RV cavity and mildly reduced RV function. CHADSVASC felt to be zero, so now on ASA only. Last labs 01/2017 showed WBC 10.7, Hgb 15.6, plt 229, K 3.8, Cr 0.89, LFTs OK.  He returns for follow-up reporting a vague crampy focal left sided chest pain, constant for the last 2 weeks, ranging from 1/10-7/10. It has mostly been aggravated by very specific twisting movements which elicit a brief increase in baseline discomfort. It is improved with coughing, taking deep breaths, or leaning forward. He does not feel it is explicitly related to exertion. He does recall it occurring on two occasions while exerting himself (picking up a puppy, or picking up a 50lb bag outside), but he thinks this was related to the specific movement itself. It is not worse with inspiration. He is not tachycardic, tachypneic or hypoxic. He can just barely feel it currently. He notes a generalized history of muscle cramping, particularly in the evening after working outside in the yard all day, but reports he doesn't really drink a lot of fluids. He has not had any sustained palpitations. Every so often in stressful situations he will feel a brief palpitation but nothing sustained. He's not had to use any diltiazem.   Past Medical History:  Diagnosis Date  . GERD (gastroesophageal reflux disease)   . Obesity   . Paroxysmal atrial flutter ([place])   . PSVT (paroxysmal  supraventricular tachycardia) ([place])     Past Surgical History:  Procedure Laterality Date  . ELECTROPHYSIOLOGIC STUDY N/A 05/02/2015   Procedure: A-Flutter Ablation;  Surgeon: Evans Lance, MD;  Location: [place] [facility];  Service: Cardiovascular;  Laterality: N/A;  . right toe surgery      Current Medications: Current Meds  Medication Sig  . aspirin 325 MG tablet Take 325 mg by mouth daily.  . cyclobenzaprine (FLEXERIL) 10 MG tablet Take 1 tablet (10 mg total) by mouth 2 (two) times daily as needed for muscle spasms.  Marland Kitchen diltiazem (CARDIZEM) 30 MG tablet Take 1 tablet (30 mg total) by mouth 2 (two) times daily as needed (as needed for palpitations).  . sildenafil (VIAGRA) 50 MG tablet Take 1 tablet (50 mg total) by mouth daily as needed for erectile dysfunction.   Allergies:   Patient has no known allergies.   Social History   Socioeconomic History  . Marital status: Divorced    Spouse name: Not on file  . Number of children: Not on file  . Years of education: Not on file  . Highest education level: Not on file  Occupational History  . Occupation: Designer, [employer]  Social Needs  . Financial resource strain: Not on file  . Food insecurity:    Worry: Not on file    Inability: Not on file  . Transportation needs:    Medical: Not on file    Non-medical: Not on file  Tobacco Use  . Smoking status: Never Smoker  . Smokeless tobacco: Never Used  Substance and Sexual Activity  . Alcohol use: Yes  . Drug use: No  . Sexual activity: Not on file  Lifestyle  . Physical activity:    Days per week: Not on file    Minutes per session: Not on file  . Stress: Not on file  Relationships  . Social connections:    Talks on phone: Not on file    Gets together: Not on file    Attends religious service: Not on file    Active member of club or organization: Not on file    Attends meetings of clubs or organizations: Not on file    Relationship status:  Not on file  Other Topics Concern  . Not on file  Social History Narrative  . Not on file     Family History:  The patient's family history includes Aneurysm (age of onset: 62) in his mother; Congestive Heart Failure in his father; Diabetes in his father.  ROS:   Please see the history of present illness.  All other systems are reviewed and otherwise negative.    PHYSICAL EXAM:   VS:  BP 134/78   Pulse 78   Ht 6\' 2"  (1.88 m)   Wt 257 lb 6.4 oz (116.8 kg)   SpO2 96%   BMI 33.05 kg/m   BMI: Body mass index is 33.05 kg/m. BP 136/80 in R arm, 137/78 in L arm GEN: Well nourished, well developed WM, in no acute distress HEENT: normocephalic, atraumatic Neck: no JVD, carotid bruits, or masses. No subclavian bruits Cardiac: RRR; no murmurs, rubs, or gallops, no edema . 2+ pedal pulses bilaterally.  Respiratory:  clear to auscultation bilaterally, normal work of breathing GI: soft, nontender, nondistended, + BS. No bruit MS: no deformity or atrophy Skin: warm and dry, no rash Neuro:  Alert and Oriented x 3, Strength and sensation are intact, follows commands Psych: euthymic mood, full affect  Wt Readings from Last 3 Encounters:  11/11/17 257 lb 6.4 oz (116.8 kg)  01/23/17 250 lb (113.4 kg)  05/31/15 262 lb 3.2 oz (118.9 kg)      Studies/Labs Reviewed:   EKG:  EKG was ordered today and personally reviewed by me and demonstrates NSR 78bpm with nonspecific ST T changes - TWI I, V3-V5 with TW flattening in avL, V6, mild ST sagging inferiorly <0.31mm  Recent Labs: 01/23/2017: ALT 34; BUN 26; Creatinine, Ser 0.89; Hemoglobin 15.6; Platelets 229; Potassium 3.8; Sodium 136   Lipid Panel    Component Value Date/Time   CHOL 190 01/17/2011 0231   TRIG 130 01/17/2011 0231   HDL 47 01/17/2011 0231   CHOLHDL 4.0 01/17/2011 0231   VLDL 26 01/17/2011 0231   LDLCALC 117 (H) 01/17/2011 0231    Additional studies/ records that were reviewed today include: Summarized  above.   ASSESSMENT & PLAN:   1. Chest pain - reviewed with Dr. Lovena Le. By history the chest pain is very atypical for cardiac pathology and sounds musculoskeletal. EKG today though is abnormal when compared to last tracing. However, it also appears he had spontaneous TW changes back by EKG in 2016 as well. Cause for TW changes is unclear. He does not have any other clinical signs of PE/DVT, CHF, aortic dissection, or pericarditis. Pulses are brisk and equal throughout and blood pressures are equal in both arms, similar to the reading obtained by the tech. I reviewed with Dr. Lovena Le who  does not feel strongly that we should be concerned for cardiac pathology given the fluctuating EKG changes back to 2016. He suggested arranging a stress echocardiogram. I will also check labs to r/o metabolic abnormality given his reports of occasional diffuse muscle cramping as well, and give a trial of flexeril. Sedative precautions reviewed. [place] reviewed and no inappropriate fills seen. Given chronicity of chest pain, will check stat troponin. If abnormal, I would recommend he be sent to the ER for further evaluation. If normal, I think we can feel assured there is no acute cardiac issue happening and proceed with stress echo as usual. I will sign out to the APP on call once I leave the office tonight if it's not back by the time I leave. 2. Paroxysmal atrial flutter/PSVT - pt feels generally controlled. Continue to monitor. 3. Obesity - discussed long term weight loss briefly with how it relates to #5. 4. Sleep disordered breathing - he would like to defer previously recommended sleep study but we can revisit once acute issues settle down.  Disposition: F/u with APP in 3- 4 weeks.  Medication Adjustments/Labs and Tests Ordered: Current medicines are reviewed at length with the patient today.  Concerns regarding medicines are outlined above. Medication changes, Labs and Tests ordered today are summarized  above and listed in the Patient Instructions accessible in Encounters.   Signed, Charlie Pitter, PA-C  11/11/2017 2:55 PM    [address], [place], [place]  [address] Phone: [phone]; Fax: [phone]

## 2017-11-11 ENCOUNTER — Encounter (INDEPENDENT_AMBULATORY_CARE_PROVIDER_SITE_OTHER): Payer: Self-pay

## 2017-11-11 ENCOUNTER — Encounter: Payer: Self-pay | Admitting: Physician Assistant

## 2017-11-11 ENCOUNTER — Ambulatory Visit: Payer: BLUE CROSS/BLUE SHIELD | Admitting: Physician Assistant

## 2017-11-11 VITALS — BP 134/78 | HR 78 | Ht 74.0 in | Wt 257.4 lb

## 2017-11-11 DIAGNOSIS — I471 Supraventricular tachycardia, unspecified: Secondary | ICD-10-CM

## 2017-11-11 DIAGNOSIS — R079 Chest pain, unspecified: Secondary | ICD-10-CM | POA: Diagnosis not present

## 2017-11-11 DIAGNOSIS — G4730 Sleep apnea, unspecified: Secondary | ICD-10-CM | POA: Diagnosis not present

## 2017-11-11 DIAGNOSIS — E669 Obesity, unspecified: Secondary | ICD-10-CM | POA: Diagnosis not present

## 2017-11-11 DIAGNOSIS — I4892 Unspecified atrial flutter: Secondary | ICD-10-CM

## 2017-11-11 DIAGNOSIS — R252 Cramp and spasm: Secondary | ICD-10-CM | POA: Diagnosis not present

## 2017-11-11 LAB — TROPONIN T: Troponin T TROPT: <0.011 ng/mL (ref <0.011)

## 2017-11-11 MED ORDER — CYCLOBENZAPRINE HCL 10 MG PO TABS: 10.0000 mg | ORAL_TABLET | Freq: Two times a day (BID) | ORAL | 0 refills | Status: DC | PRN

## 2017-11-11 NOTE — Patient Instructions (Signed)
Medication Instructions:  Your physician has recommended you make the following change in your medication:  1. Pt was given a printed prescription for flexeril (10 mg ) twice daily by mouth as needed for muscle cramping.   Labwork: Your physician recommends that you have lab work today: bemt/mag/cbc/tsh/  STAT Troponin   Testing/Procedures: Your physician has requested that you have a stress echocardiogram. For further information please visit [email or web address]. Please follow instruction sheet as given.    Follow-Up: Your physician recommends that you keep your scheduled  follow-up appointment with Steven Barrios, NP.   Any Other Special Instructions Will Be Listed Below (If Applicable).     If you need a refill on your cardiac medications before your next appointment, please call your pharmacy.

## 2017-11-12 LAB — BASIC METABOLIC PANEL
BUN/Creatinine Ratio: 26 — ABNORMAL HIGH (ref 9–20)
BUN: 19 mg/dL (ref 6–24)
CALCIUM: 9.5 mg/dL (ref 8.7–10.2)
CHLORIDE: 104 mmol/L (ref 96–106)
CO2: 25 mmol/L (ref 20–29)
CREATININE: 0.74 mg/dL — AB (ref 0.76–1.27)
GFR, EST AFRICAN AMERICAN: 119 mL/min/{1.73_m2} (ref >59)
GFR, EST NON AFRICAN AMERICAN: 103 mL/min/{1.73_m2} (ref >59)
Glucose: 85 mg/dL (ref 65–99)
POTASSIUM: 4.3 mmol/L (ref 3.5–5.2)
Sodium: 143 mmol/L (ref 134–144)

## 2017-11-12 LAB — CBC
Hematocrit: 42.7 % (ref 37.5–51.0)
Hemoglobin: 14.8 g/dL (ref 13.0–17.7)
MCH: 30.3 pg (ref 26.6–33.0)
MCHC: 34.7 g/dL (ref 31.5–35.7)
MCV: 88 fL (ref 79–97)
PLATELETS: 204 10*3/uL (ref 150–450)
RBC: 4.88 x10E6/uL (ref 4.14–5.80)
RDW: 13.6 % (ref 12.3–15.4)
WBC: 6.2 10*3/uL (ref 3.4–10.8)

## 2017-11-12 LAB — TSH: TSH: 1.36 u[IU]/mL (ref 0.450–4.500)

## 2017-11-12 LAB — MAGNESIUM: MAGNESIUM: 1.9 mg/dL (ref 1.6–2.3)

## 2017-11-20 ENCOUNTER — Ambulatory Visit (HOSPITAL_COMMUNITY): Payer: BLUE CROSS/BLUE SHIELD | Attending: Cardiovascular Disease

## 2017-11-20 ENCOUNTER — Ambulatory Visit (HOSPITAL_COMMUNITY): Payer: BLUE CROSS/BLUE SHIELD

## 2017-11-20 DIAGNOSIS — R079 Chest pain, unspecified: Secondary | ICD-10-CM | POA: Insufficient documentation

## 2017-11-20 DIAGNOSIS — I4892 Unspecified atrial flutter: Secondary | ICD-10-CM | POA: Insufficient documentation

## 2017-11-20 DIAGNOSIS — E669 Obesity, unspecified: Secondary | ICD-10-CM | POA: Diagnosis not present

## 2017-11-20 DIAGNOSIS — I471 Supraventricular tachycardia: Secondary | ICD-10-CM | POA: Diagnosis not present

## 2017-11-20 DIAGNOSIS — Z6833 Body mass index (BMI) 33.0-33.9, adult: Secondary | ICD-10-CM | POA: Insufficient documentation

## 2017-11-20 DIAGNOSIS — G4730 Sleep apnea, unspecified: Secondary | ICD-10-CM

## 2017-11-20 MED ORDER — PERFLUTREN LIPID MICROSPHERE
1.0000 mL | INTRAVENOUS | Status: AC | PRN
  Administered 2017-11-20 (×2): 2 mL via INTRAVENOUS
  Administered 2017-11-20: 1 mL via INTRAVENOUS

## 2017-12-09 ENCOUNTER — Ambulatory Visit: Payer: BLUE CROSS/BLUE SHIELD | Admitting: Physician Assistant

## 2018-06-16 DIAGNOSIS — D171 Benign lipomatous neoplasm of skin and subcutaneous tissue of trunk: Secondary | ICD-10-CM | POA: Diagnosis not present

## 2018-06-16 DIAGNOSIS — D2261 Melanocytic nevi of right upper limb, including shoulder: Secondary | ICD-10-CM | POA: Diagnosis not present

## 2018-06-16 DIAGNOSIS — D2262 Melanocytic nevi of left upper limb, including shoulder: Secondary | ICD-10-CM | POA: Diagnosis not present

## 2018-06-16 DIAGNOSIS — L570 Actinic keratosis: Secondary | ICD-10-CM | POA: Diagnosis not present

## 2018-07-09 NOTE — Progress Notes (Signed)
Cardiology Office Note:    Date:  07/10/2018   ID:  [name], DOB [birth year], MRN [id number]  PCP:  Steven Peppers, MD  Cardiologist/Electrophysiologist:  Steven Peru, MD     Referring MD: Steven Peppers, MD   Chief Complaint  Patient presents with  . BP Elevated    History of Present Illness:    [name] is a 58 y.o. male with SVT and atrial flutter status post ablation in 2016.  He was last seen in 7/19 by Steven Copa, PA-C for chest pain.  ETT-Echo was normal.     [name] returns for evaluation of blood pressure.  He has noted his BP is 130-140s/80-90s.  He was taking a lot of Naproxen, but stopped this 2 weeks ago.  He has not had any significant change in his BP.  He works in Biomedical scientist outside.  He also plays ice hockey.  He does not have exertional chest pain or shortness of breath.  He does have "cramps" in his chest and abdomen after a long day at work or after being active.  Sometimes, it occurs with laying flat.  He denies paroxysmal nocturnal dyspnea, leg swelling, syncope.  He does snore.  Prior CV studies:   The following studies were reviewed today:  Stress echocardiogram 11/20/2017 Negative stress echo. no evidence of ischemia.  Echocardiogram 12/23/2014 Inferior hypokinesis, mild LVH, EF 50, mildly reduced RV SF  Echo 9/12:  mild LVH, EF 60-65%.    ETT-myoview 10/12:  no ischemia   Past Medical History:  Diagnosis Date  . GERD (gastroesophageal reflux disease)   . Obesity   . Paroxysmal atrial flutter ([place])   . PSVT (paroxysmal supraventricular tachycardia) ([place])    Surgical Hx: The patient  has a past surgical history that includes right toe surgery and Cardiac catheterization (N/A, 05/02/2015).   Current Medications: Current Meds  Medication Sig  . [DISCONTINUED] aspirin 325 MG tablet Take 325 mg by mouth daily.     Allergies:   Patient has no known allergies.   Social History   Tobacco Use  . Smoking status: Never  Smoker  . Smokeless tobacco: Never Used  Substance Use Topics  . Alcohol use: Yes  . Drug use: No     Family Hx: The patient's family history includes Aneurysm (age of onset: 39) in his mother; Congestive Heart Failure in his father; Diabetes in his father.  ROS:   Please see the history of present illness.    Review of Systems  Musculoskeletal: Positive for myalgias.   All other systems reviewed and are negative.   EKGs/Labs/Other Test Reviewed:    EKG:  EKG is  ordered today.  The ekg ordered today demonstrates normal sinus rhythm, HR 67, normal axis, QTc 420  Recent Labs: 11/11/2017: BUN 19; Creatinine, Ser 0.74; Hemoglobin 14.8; Magnesium 1.9; Platelets 204; Potassium 4.3; Sodium 143; TSH 1.360   Recent Lipid Panel Lab Results  Component Value Date/Time   CHOL 190 01/17/2011 02:31 AM   TRIG 130 01/17/2011 02:31 AM   HDL 47 01/17/2011 02:31 AM   CHOLHDL 4.0 01/17/2011 02:31 AM   LDLCALC 117 (H) 01/17/2011 02:31 AM    Physical Exam:    VS:  BP (!) 141/70   Pulse 67   Ht 6\' 2"  (1.88 m)   Wt 261 lb 1.9 oz (118.4 kg)   SpO2 97%   BMI 33.53 kg/m     Wt Readings from Last 3 Encounters:  07/10/18 261  lb 1.9 oz (118.4 kg)  11/11/17 257 lb 6.4 oz (116.8 kg)  01/23/17 250 lb (113.4 kg)     Physical Exam  Constitutional: He is oriented to person, place, and time. He appears well-developed and well-nourished. No distress.  HENT:  Head: Normocephalic and atraumatic.  Neck: Neck supple. No JVD present. Carotid bruit is not present.  Cardiovascular: Normal rate, regular rhythm, S1 normal and S2 normal.  No murmur heard. Pulmonary/Chest: Breath sounds normal. He has no rales.  Abdominal: Soft. There is no hepatomegaly.  Musculoskeletal:        General: No edema.  Neurological: He is alert and oriented to person, place, and time.  Skin: Skin is warm and dry.    ASSESSMENT & PLAN:    Atrial flutter, unspecified type ([place]) / PSVT (paroxysmal supraventricular  tachycardia) ([facility]) Hx of ablation without apparent recurrence.    Elevated blood pressure reading without diagnosis of hypertension   His BP is borderline elevated.  We discussed pursuing lifestyle changes such as reducing sodium, increasing activity and losing weight.  If his BP does not improve, will probably start HCTZ to achieve goal.  He does not need to take ASA 325.  He can reduce this to 81 mg once daily.  He can take Tylenol for pain and limit his use of NSAIDs.  I will get labs:  BMET, CBC, Mg2+, LFTs, TSH.  I recommended a home sleep test due to his hx of snoring.  He declines at this time.  He will monitor his BP and bring those readings in to his FU appt.  FU in 6 weeks.     Dispo:  Return in about 6 weeks (around 08/21/2018) for Routine Follow Up, w/ Steven Dopp, PA-C.   Medication Adjustments/Labs and Tests Ordered: Current medicines are reviewed at length with the patient today.  Concerns regarding medicines are outlined above.  Tests Ordered: Orders Placed This Encounter  Procedures  . Basic metabolic panel  . CBC  . Hepatic function panel  . TSH  . Magnesium  . EKG 12-Lead   Medication Changes: Meds ordered this encounter  Medications  . aspirin EC 81 MG tablet    Sig: Take 1 tablet (81 mg total) by mouth daily.    Dispense:  90 tablet    Refill:  3    Signed, Steven Dopp, PA-C  07/10/2018 1:43 PM    [facility] [address], [place], [place]  [address] Phone: [phone]; Fax: [phone]

## 2018-07-10 ENCOUNTER — Encounter: Payer: Self-pay | Admitting: Physician Assistant

## 2018-07-10 ENCOUNTER — Ambulatory Visit: Payer: BLUE CROSS/BLUE SHIELD | Admitting: Physician Assistant

## 2018-07-10 VITALS — BP 141/70 | HR 67 | Ht 74.0 in | Wt 261.1 lb

## 2018-07-10 DIAGNOSIS — I4892 Unspecified atrial flutter: Secondary | ICD-10-CM

## 2018-07-10 DIAGNOSIS — I471 Supraventricular tachycardia: Secondary | ICD-10-CM | POA: Diagnosis not present

## 2018-07-10 DIAGNOSIS — R030 Elevated blood-pressure reading, without diagnosis of hypertension: Secondary | ICD-10-CM | POA: Diagnosis not present

## 2018-07-10 MED ORDER — ASPIRIN EC 81 MG PO TBEC: 81.0000 mg | DELAYED_RELEASE_TABLET | Freq: Every day | ORAL | 3 refills | Status: AC

## 2018-07-10 NOTE — Patient Instructions (Signed)
Medication Instructions:  Your physician has recommended you make the following change in your medication:  1.  DECREASE ASPIRIN TO 81 MG DAILY.  2. YOU MAY TAKE TYLENOL FOR PAIN AS NEEDED ( MAY TAKE A DOSE OF 4000 MG PER DAY)  If you need a refill on your cardiac medications before your next appointment, please call your pharmacy.   Lab work: TODAY: BMET, MAGNESIUM, CBC, TSH, LFTS  If you have labs (blood work) drawn today and your tests are completely normal, you will receive your results only by: Marland Kitchen MyChart Message (if you have MyChart) OR . A paper copy in the mail If you have any lab test that is abnormal or we need to change your treatment, we will call you to review the results.  Testing/Procedures: NONE  Follow-Up: At [facility], you and your health needs are our priority.  As part of our continuing mission to provide you with exceptional heart care, we have created designated Provider Care Teams.  These Care Teams include your primary Cardiologist (physician) and Advanced Practice Providers (APPs -  Physician Assistants and Nurse Practitioners) who all work together to provide you with the care you need, when you need it. You will need a follow up appointment in:  6 weeks with Richardson Dopp, PA-C   Any Other Special Instructions Will Be Listed Below (If Applicable). Please check your blood pressure 1-2 times per day for 2 weeks and bring in your readings to next appointment with Richardson Dopp, PA-C in 6 weeks.   Please increase walking and work on losing weight.   Low-Sodium Eating Plan Sodium, which is an element that makes up salt, helps you maintain a healthy balance of fluids in your body. Too much sodium can increase your blood pressure and cause fluid and waste to be held in your body. Your health care provider or dietitian may recommend following this plan if you have high blood pressure (hypertension), kidney disease, liver disease, or heart failure. Eating less  sodium can help lower your blood pressure, reduce swelling, and protect your heart, liver, and kidneys. What are tips for following this plan? General guidelines  Most people on this plan should limit their sodium intake to 1,500-2,000 mg (milligrams) of sodium each day. Reading food labels   The Nutrition Facts label lists the amount of sodium in one serving of the food. If you eat more than one serving, you must multiply the listed amount of sodium by the number of servings.  Choose foods with less than 140 mg of sodium per serving.  Avoid foods with 300 mg of sodium or more per serving. Shopping  Look for lower-sodium products, often labeled as "low-sodium" or "no salt added."  Always check the sodium content even if foods are labeled as "unsalted" or "no salt added".  Buy fresh foods. ? Avoid canned foods and premade or frozen meals. ? Avoid canned, cured, or processed meats  Buy breads that have less than 80 mg of sodium per slice. Cooking  Eat more home-cooked food and less restaurant, buffet, and fast food.  Avoid adding salt when cooking. Use salt-free seasonings or herbs instead of table salt or sea salt. Check with your health care provider or pharmacist before using salt substitutes.  Cook with plant-based oils, such as canola, sunflower, or olive oil. Meal planning  When eating at a restaurant, ask that your food be prepared with less salt or no salt, if possible.  Avoid foods that contain MSG (monosodium glutamate).  MSG is sometimes added to [place] food, bouillon, and some canned foods. What foods are recommended? The items listed may not be a complete list. Talk with your dietitian about what dietary choices are best for you. Grains Low-sodium cereals, including oats, puffed wheat and rice, and shredded wheat. Low-sodium crackers. Unsalted rice. Unsalted pasta. Low-sodium bread. Whole-grain breads and whole-grain pasta. Vegetables Fresh or frozen vegetables.  "No salt added" canned vegetables. "No salt added" tomato sauce and paste. Low-sodium or reduced-sodium tomato and vegetable juice. Fruits Fresh, frozen, or canned fruit. Fruit juice. Meats and other protein foods Fresh or frozen (no salt added) meat, poultry, seafood, and fish. Low-sodium canned tuna and salmon. Unsalted nuts. Dried peas, beans, and lentils without added salt. Unsalted canned beans. Eggs. Unsalted nut butters. Dairy Milk. Soy milk. Cheese that is naturally low in sodium, such as ricotta cheese, fresh mozzarella, or Swiss cheese Low-sodium or reduced-sodium cheese. Cream cheese. Yogurt. Fats and oils Unsalted butter. Unsalted margarine with no trans fat. Vegetable oils such as canola or olive oils. Seasonings and other foods Fresh and dried herbs and spices. Salt-free seasonings. Low-sodium mustard and ketchup. Sodium-free salad dressing. Sodium-free light mayonnaise. Fresh or refrigerated horseradish. Lemon juice. Vinegar. Homemade, reduced-sodium, or low-sodium soups. Unsalted popcorn and pretzels. Low-salt or salt-free chips. What foods are not recommended? The items listed may not be a complete list. Talk with your dietitian about what dietary choices are best for you. Grains Instant hot cereals. Bread stuffing, pancake, and biscuit mixes. Croutons. Seasoned rice or pasta mixes. Noodle soup cups. Boxed or frozen macaroni and cheese. Regular salted crackers. Self-rising flour. Vegetables Sauerkraut, pickled vegetables, and relishes. Olives. [place] fries. Onion rings. Regular canned vegetables (not low-sodium or reduced-sodium). Regular canned tomato sauce and paste (not low-sodium or reduced-sodium). Regular tomato and vegetable juice (not low-sodium or reduced-sodium). Frozen vegetables in sauces. Meats and other protein foods Meat or fish that is salted, canned, smoked, spiced, or pickled. Bacon, ham, sausage, hotdogs, corned beef, chipped beef, packaged lunch meats, salt  pork, jerky, pickled herring, anchovies, regular canned tuna, sardines, salted nuts. Dairy Processed cheese and cheese spreads. Cheese curds. Blue cheese. Feta cheese. String cheese. Regular cottage cheese. Buttermilk. Canned milk. Fats and oils Salted butter. Regular margarine. Ghee. Bacon fat. Seasonings and other foods Onion salt, garlic salt, seasoned salt, table salt, and sea salt. Canned and packaged gravies. Worcestershire sauce. Tartar sauce. Barbecue sauce. Teriyaki sauce. Soy sauce, including reduced-sodium. Steak sauce. Fish sauce. Oyster sauce. Cocktail sauce. Horseradish that you find on the shelf. Regular ketchup and mustard. Meat flavorings and tenderizers. Bouillon cubes. Hot sauce and Tabasco sauce. Premade or packaged marinades. Premade or packaged taco seasonings. Relishes. Regular salad dressings. Salsa. Potato and tortilla chips. Corn chips and puffs. Salted popcorn and pretzels. Canned or dried soups. Pizza. Frozen entrees and pot pies. Summary  Eating less sodium can help lower your blood pressure, reduce swelling, and protect your heart, liver, and kidneys.  Most people on this plan should limit their sodium intake to 1,500-2,000 mg (milligrams) of sodium each day.  Canned, boxed, and frozen foods are high in sodium. Restaurant foods, fast foods, and pizza are also very high in sodium. You also get sodium by adding salt to food.  Try to cook at home, eat more fresh fruits and vegetables, and eat less fast food, canned, processed, or prepared foods. This information is not intended to replace advice given to you by your health care provider. Make sure you discuss any questions you  have with your health care provider. Document Released: 10/12/2001 Document Revised: 04/15/2016 Document Reviewed: 04/15/2016 Elsevier Interactive Patient Education  2019 [employer].

## 2018-07-11 LAB — BASIC METABOLIC PANEL
BUN / CREAT RATIO: 23 — AB (ref 9–20)
BUN: 18 mg/dL (ref 6–24)
CO2: 22 mmol/L (ref 20–29)
CREATININE: 0.8 mg/dL (ref 0.76–1.27)
Calcium: 9.2 mg/dL (ref 8.7–10.2)
Chloride: 102 mmol/L (ref 96–106)
GFR calc non Af Amer: 100 mL/min/{1.73_m2} (ref >59)
GFR, EST AFRICAN AMERICAN: 115 mL/min/{1.73_m2} (ref >59)
GLUCOSE: 104 mg/dL — AB (ref 65–99)
Potassium: 4.3 mmol/L (ref 3.5–5.2)
SODIUM: 138 mmol/L (ref 134–144)

## 2018-07-11 LAB — CBC
Hematocrit: 42.4 % (ref 37.5–51.0)
Hemoglobin: 14.7 g/dL (ref 13.0–17.7)
MCH: 30.1 pg (ref 26.6–33.0)
MCHC: 34.7 g/dL (ref 31.5–35.7)
MCV: 87 fL (ref 79–97)
PLATELETS: 229 10*3/uL (ref 150–450)
RBC: 4.89 x10E6/uL (ref 4.14–5.80)
RDW: 12.1 % (ref 11.6–15.4)
WBC: 5.7 10*3/uL (ref 3.4–10.8)

## 2018-07-11 LAB — TSH: TSH: 1.94 u[IU]/mL (ref 0.450–4.500)

## 2018-07-11 LAB — MAGNESIUM: MAGNESIUM: 1.9 mg/dL (ref 1.6–2.3)

## 2018-07-11 LAB — HEPATIC FUNCTION PANEL
ALT: 25 IU/L (ref 0–44)
AST: 16 IU/L (ref 0–40)
Albumin: 4.5 g/dL (ref 3.8–4.9)
Alkaline Phosphatase: 89 IU/L (ref 39–117)
BILIRUBIN TOTAL: 0.5 mg/dL (ref 0.0–1.2)
Bilirubin, Direct: 0.12 mg/dL (ref 0.00–0.40)
Total Protein: 6.8 g/dL (ref 6.0–8.5)

## 2018-07-13 ENCOUNTER — Telehealth: Payer: Self-pay

## 2018-07-13 NOTE — Telephone Encounter (Signed)
Notes recorded by Frederik Schmidt, RN on 07/13/2018 at 9:47 AM EDT The patient has been notified of the result and verbalized understanding. All questions (if any) were answered. Frederik Schmidt, RN 07/13/2018 9:47 AM

## 2018-07-13 NOTE — Telephone Encounter (Signed)
-----   Message from Liliane Shi, [place] sent at 07/13/2018  7:48 AM EDT ----- Creatinine normal, K+ normal, Hgb normal, LFTs normal, TSH normal, Mg2+ normal.  Recommendations:  - Continue current medications and follow up as planned.  Richardson Dopp, PA-C    07/13/2018 7:47 AM

## 2018-08-31 ENCOUNTER — Telehealth: Payer: Self-pay | Admitting: *Deleted

## 2018-08-31 NOTE — Telephone Encounter (Signed)
2 PT NUMBER  ATTEMPTS M WRONG NUMBER H NO VM

## 2018-09-03 NOTE — Progress Notes (Deleted)
{Choose 1 Note Type (Telehealth Visit or Telephone Visit):[phone]}   Date:  09/03/2018   ID:  [name], DOB March 05, 1961, MRN [id number]  {Patient [phone]::"Home"} {Provider [phone]::"Home"}  PCP:  [name], MD  Cardiologist/Electrophysiologist:  [name], MD ***    Evaluation Performed:  {Choose Visit Type:[phone]::"Follow-Up Visit"}  Chief Complaint:  ***  History of Present Illness:    [name] is a 58 y.o. male with SVT and atrial flutter status post ablation in 2016.  ***  The patient {does/does not:200015} have symptoms concerning for COVID-19 infection (fever, chills, cough, or new shortness of breath).    Past Medical History:  Diagnosis Date  . GERD (gastroesophageal reflux disease)   . Obesity   . Paroxysmal atrial flutter ([place])   . PSVT (paroxysmal supraventricular tachycardia) ([place])    Past Surgical History:  Procedure Laterality Date  . ELECTROPHYSIOLOGIC STUDY N/A 05/02/2015   Procedure: A-Flutter Ablation;  Surgeon: Evans Lance, MD;  Location: [place] [facility];  Service: Cardiovascular;  Laterality: N/A;  . right toe surgery       No outpatient medications have been marked as taking for the 09/04/18 encounter (Appointment) with Richardson Dopp T, PA-C.     Allergies:   Patient has no known allergies.   Social History   Tobacco Use  . Smoking status: Never Smoker  . Smokeless tobacco: Never Used  Substance Use Topics  . Alcohol use: Yes  . Drug use: No     Family Hx: The patient's family history includes Aneurysm (age of onset: 12) in his mother; Congestive Heart Failure in his father; Diabetes in his father.  ROS:   Please see the history of present illness.    *** All other systems reviewed and are negative.   Prior CV studies:   The following studies were reviewed today:  *** Stress echocardiogram 11/20/2017 Negative stress echo. no evidence of ischemia.   Echocardiogram  12/23/2014 Inferior hypokinesis, mild LVH, EF 50, mildly reduced RV SF   Echo 9/12:  mild LVH, EF 60-65%.     ETT-myoview 10/12:  no ischemia  Labs/Other Tests and Data Reviewed:    EKG:  {EKG/Telemetry Strips Reviewed:[phone]}  Recent Labs: 07/10/2018: ALT 25; BUN 18; Creatinine, Ser 0.80; Hemoglobin 14.7; Magnesium 1.9; Platelets 229; Potassium 4.3; Sodium 138; TSH 1.940   Recent Lipid Panel Lab Results  Component Value Date/Time   CHOL 190 01/17/2011 02:31 AM   TRIG 130 01/17/2011 02:31 AM   HDL 47 01/17/2011 02:31 AM   CHOLHDL 4.0 01/17/2011 02:31 AM   LDLCALC 117 (H) 01/17/2011 02:31 AM    Wt Readings from Last 3 Encounters:  07/10/18 261 lb 1.9 oz (118.4 kg)  11/11/17 257 lb 6.4 oz (116.8 kg)  01/23/17 250 lb (113.4 kg)     Objective:    Vital Signs:  There were no vitals taken for this visit.   {HeartCare Virtual Exam (Optional):[phone]::"VITAL SIGNS:  reviewed"}  ASSESSMENT & PLAN:    1. *** Atrial flutter, unspecified type (HCC) / PSVT (paroxysmal supraventricular tachycardia) (HCC) Hx of ablation without apparent recurrence.     Elevated blood pressure reading without diagnosis of hypertension   His BP is borderline elevated.  We discussed pursuing lifestyle changes such as reducing sodium, increasing activity and losing weight.  If his BP does not improve, will probably start HCTZ to achieve goal.  He does not need to take ASA 325.  He can reduce this to 81 mg once daily.  He can take Tylenol for pain and limit his use of NSAIDs.  I will get labs:  BMET, CBC, Mg2+, LFTs, TSH.  I recommended a home sleep test due to his hx of snoring.  He declines at this time.  He will monitor his BP and bring those readings in to his FU appt.  FU in 6 weeks.    COVID-19 Education: The signs and symptoms of COVID-19 were discussed with the patient and how to seek care for testing (follow up with PCP or arrange E-visit).  ***The importance of social distancing was  discussed today.  Time:   Today, I have spent *** minutes with the patient with telehealth technology discussing the above problems.     Medication Adjustments/Labs and Tests Ordered: Current medicines are reviewed at length with the patient today.  Concerns regarding medicines are outlined above.   Tests Ordered: No orders of the defined types were placed in this encounter.   Medication Changes: No orders of the defined types were placed in this encounter.   Disposition:  Follow up {follow up:15908}  Signed, Richardson Dopp, PA-C  09/03/2018 9:01 PM    [facility]

## 2018-09-04 ENCOUNTER — Ambulatory Visit: Payer: BLUE CROSS/BLUE SHIELD | Admitting: Physician Assistant

## 2018-09-04 ENCOUNTER — Telehealth: Payer: Self-pay | Admitting: *Deleted

## 2018-09-04 NOTE — Telephone Encounter (Signed)
SPOKE WITH WIFE ABOUT APPT AND WASN'T TO CALL BACK AND RESCHEDULE  WHEN PT ARE IN OFFICE FOR VISIT

## 2018-12-03 ENCOUNTER — Emergency Department (HOSPITAL_COMMUNITY)
Admission: EM | Admit: 2018-12-03 | Discharge: 2018-12-03 | Disposition: A | Discharge status: Home / Self Care | Payer: BC Managed Care – PPO | Attending: Emergency Medicine | Admitting: Emergency Medicine

## 2018-12-03 ENCOUNTER — Other Ambulatory Visit: Payer: Self-pay

## 2018-12-03 ENCOUNTER — Other Ambulatory Visit: Payer: Self-pay | Admitting: Internal Medicine

## 2018-12-03 DIAGNOSIS — I10 Essential (primary) hypertension: Secondary | ICD-10-CM | POA: Diagnosis not present

## 2018-12-03 DIAGNOSIS — R0902 Hypoxemia: Secondary | ICD-10-CM | POA: Diagnosis not present

## 2018-12-03 DIAGNOSIS — E860 Dehydration: Secondary | ICD-10-CM | POA: Insufficient documentation

## 2018-12-03 DIAGNOSIS — R000 Tachycardia, unspecified: Secondary | ICD-10-CM | POA: Diagnosis not present

## 2018-12-03 DIAGNOSIS — R52 Pain, unspecified: Secondary | ICD-10-CM | POA: Diagnosis not present

## 2018-12-03 DIAGNOSIS — Z7982 Long term (current) use of aspirin: Secondary | ICD-10-CM | POA: Insufficient documentation

## 2018-12-03 DIAGNOSIS — R252 Cramp and spasm: Secondary | ICD-10-CM | POA: Diagnosis present

## 2018-12-03 DIAGNOSIS — R079 Chest pain, unspecified: Secondary | ICD-10-CM | POA: Diagnosis not present

## 2018-12-03 LAB — COMPREHENSIVE METABOLIC PANEL
ALT: 43 U/L (ref 0–44)
AST: 30 U/L (ref 15–41)
Albumin: 4.5 g/dL (ref 3.5–5.0)
Alkaline Phosphatase: 70 U/L (ref 38–126)
Anion gap: 11 (ref 5–15)
BUN: 29 mg/dL — ABNORMAL HIGH (ref 6–20)
CO2: 22 mmol/L (ref 22–32)
Calcium: 9.4 mg/dL (ref 8.9–10.3)
Chloride: 104 mmol/L (ref 98–111)
Creatinine, Ser: 1.37 mg/dL — ABNORMAL HIGH (ref 0.61–1.24)
GFR calc Af Amer: >60 mL/min (ref >60)
GFR calc non Af Amer: 57 mL/min — ABNORMAL LOW (ref >60)
Glucose, Bld: 107 mg/dL — ABNORMAL HIGH (ref 70–99)
Potassium: 4.3 mmol/L (ref 3.5–5.1)
Sodium: 137 mmol/L (ref 135–145)
Total Bilirubin: 1.2 mg/dL (ref 0.3–1.2)
Total Protein: 7.1 g/dL (ref 6.5–8.1)

## 2018-12-03 LAB — CBC
HCT: 45.1 % (ref 39.0–52.0)
Hemoglobin: 15.9 g/dL (ref 13.0–17.0)
MCH: 31.1 pg (ref 26.0–34.0)
MCHC: 35.3 g/dL (ref 30.0–36.0)
MCV: 88.1 fL (ref 80.0–100.0)
Platelets: 222 10*3/uL (ref 150–400)
RBC: 5.12 MIL/uL (ref 4.22–5.81)
RDW: 12.1 % (ref 11.5–15.5)
WBC: 10.8 10*3/uL — ABNORMAL HIGH (ref 4.0–10.5)
nRBC: 0 % (ref 0.0–0.2)

## 2018-12-03 LAB — MAGNESIUM: Magnesium: 2 mg/dL (ref 1.7–2.4)

## 2018-12-03 LAB — CK: Total CK: 273 U/L (ref 49–397)

## 2018-12-03 MED ORDER — LACTATED RINGERS IV BOLUS
1000.0000 mL | Freq: Once | INTRAVENOUS | Status: AC
  Administered 2018-12-03: 1000 mL via INTRAVENOUS

## 2018-12-03 NOTE — Telephone Encounter (Signed)
New Message    *STAT* If patient is at the pharmacy, call can be transferred to refill team.   1. Which medications need to be refilled? (please list name of each medication and dose if known)   CYCLOBENZAPRINE (FLEXERIL) 10 MG TABLET   2. Which pharmacy/location (including street and city if local pharmacy) is medication to be sent to?  [id number] - [place], [place] - [address]  3. Do they need a 30 day or 90 day supply? 30 day supply

## 2018-12-03 NOTE — ED Triage Notes (Addendum)
Brought in by EMS for Chest pain. Pt had been working outside all day. States he started sweating profusely and having cramping in his chest. EMS reports on arrival pt was very anxious and unable to sit for EKG. 6mcg fentanyl and 2.5mg  midazolam was administered and 1086ml NS administered en route.  Pt also took 324mg  aspirin prior to arrival.

## 2018-12-03 NOTE — ED Provider Notes (Signed)
[facility] EMERGENCY DEPARTMENT Provider Note   CSN: [id number] Arrival date & time: 12/03/18  1720    History   Chief Complaint Chief Complaint  Patient presents with  . Chest Pain    HPI [name] is a 58 y.o. male.     HPI Patient presents with muscle cramps.  Began after working outside yesterday and today.  States his hands were cramping up yesterday.  States today after work to get more severe.  Cramping in the chest and the legs.  Was diaphoretic.  Has had previous cramps like this before.  No clear cause found.  Had a negative stress echo 1 year ago.  States he drank around three quarters of a gallon of liquids today.  States it was Gatorade and some water that had electrolytes.  Anxious for EMS.  Report given fentanyl and Versed.  Patient also had 324 mg of aspirin.  Had a normal saline bolus of 1 L.  Feeling somewhat better now.  States he is unable to figure out in the past why she has been having these episodes.  Has seen cardiology for the previously. Past Medical History:  Diagnosis Date  . GERD (gastroesophageal reflux disease)   . Obesity   . Paroxysmal atrial flutter ([place])   . PSVT (paroxysmal supraventricular tachycardia) [facility])     Patient Active Problem List   Diagnosis Date Noted  . Atrial flutter ([place]) 05/02/2015  . Atrial flutter with rapid ventricular response ([place]) 12/23/2014  . Palpitations 12/23/2014  . Snoring   . Erectile dysfunction 05/13/2013  . Chest pain 03/05/2011  . Shortness of breath 03/05/2011  . PSVT (paroxysmal supraventricular tachycardia) ([place]) 02/05/2011  . Hypertension 02/05/2011    Past Surgical History:  Procedure Laterality Date  . ELECTROPHYSIOLOGIC STUDY N/A 05/02/2015   Procedure: A-Flutter Ablation;  Surgeon: Evans Lance, MD;  Location: [facility];  Service: Cardiovascular;  Laterality: N/A;  . right toe surgery          Home Medications    Prior to Admission medications    Medication Sig Start Date End Date Taking? Authorizing Provider  aspirin EC 81 MG tablet Take 1 tablet (81 mg total) by mouth daily. Patient taking differently: Take 162 mg by mouth daily.  07/10/18  Yes Richardson Dopp T, PA-C    Family History Family History  Problem Relation Age of Onset  . Aneurysm Mother 37       brain  . Congestive Heart Failure Father   . Diabetes Father     Social History Social History   Tobacco Use  . Smoking status: Never Smoker  . Smokeless tobacco: Never Used  Substance Use Topics  . Alcohol use: Yes  . Drug use: No     Allergies   Patient has no known allergies.   Review of Systems Review of Systems  Constitutional: Positive for diaphoresis. Negative for appetite change.  HENT: Negative for congestion.   Respiratory: Negative for shortness of breath.   Cardiovascular: Positive for chest pain.  Gastrointestinal: Negative for abdominal pain.  Genitourinary: Negative for flank pain.  Musculoskeletal: Positive for myalgias. Negative for back pain.       Muscle cramps.  Skin: Negative for wound.  Neurological: Negative for weakness.  Psychiatric/Behavioral: Negative for confusion.     Physical Exam Updated Vital Signs BP (!) 142/86   Pulse 80   Temp 98.4 F (36.9 C) (Oral)   Resp 19   Ht 6\' 2"  (  1.88 m)   Wt 113.4 kg   SpO2 97%   BMI 32.10 kg/m   Physical Exam Vitals signs and nursing note reviewed.  HENT:     Head: Atraumatic.  Neck:     Musculoskeletal: Neck supple.  Cardiovascular:     Rate and Rhythm: Normal rate and regular rhythm.     Heart sounds: No murmur.  Pulmonary:     Effort: No tachypnea.     Breath sounds: No wheezing, rhonchi or rales.  Musculoskeletal:     Right lower leg: He exhibits no tenderness.     Left lower leg: He exhibits no tenderness.  Skin:    General: Skin is warm.     Capillary Refill: Capillary refill takes less than 2 seconds.  Neurological:     General: No focal deficit present.      Mental Status: He is alert.      ED Treatments / Results  Labs (all labs ordered are listed, but only abnormal results are displayed) Labs Reviewed  COMPREHENSIVE METABOLIC PANEL - Abnormal; Notable for the following components:      Result Value   Glucose, Bld 107 (*)    BUN 29 (*)    Creatinine, Ser 1.37 (*)    GFR calc non Af Amer 57 (*)    All other components within normal limits  CBC - Abnormal; Notable for the following components:   WBC 10.8 (*)    All other components within normal limits  MAGNESIUM  CK    EKG EKG Interpretation  Date/Time:  Thursday December 03 2018 17:21:43 EDT Ventricular Rate:  89 PR Interval:    QRS Duration: 93 QT Interval:  354 QTC Calculation: 431 R Axis:   30 Text Interpretation:  Sinus rhythm Confirmed by Davonna Belling [phone]) on 12/03/2018 5:25:31 PM   Radiology No results found.  Procedures Procedures (including critical care time)  Medications Ordered in ED Medications  lactated ringers bolus 1,000 mL (0 mLs Intravenous Stopped 12/03/18 1947)  lactated ringers bolus 1,000 mL (0 mLs Intravenous Stopped 12/03/18 2137)     Initial Impression / Assessment and Plan / ED Course  I have reviewed the triage vital signs and the nursing notes.  Pertinent labs & imaging results that were available during my care of the patient were reviewed by me and considered in my medical decision making (see chart for details).        Patient presents with diffuse muscle cramps, also in his chest but do not think this is primary cardiac cause.  Has had previous cardiac work-up for these cramps and has been negative.  Had been outside in the heat for 2 days.  Creatinine mildly increased.  Feels much better after 2 L of lactated Ringer's.  Discharge home.  Follow-up with PCP as needed.  Final Clinical Impressions(s) / ED Diagnoses   Final diagnoses:  Dehydration    ED Discharge Orders    None       Davonna Belling, MD 12/03/18 2201

## 2018-12-03 NOTE — ED Notes (Signed)
Patient Alert and oriented to baseline. Stable and ambulatory to baseline. Patient verbalized understanding of the discharge instructions.  Patient belongings were taken by the patient.   

## 2018-12-04 ENCOUNTER — Other Ambulatory Visit: Payer: Self-pay

## 2019-05-17 ENCOUNTER — Other Ambulatory Visit: Payer: Self-pay

## 2019-05-17 ENCOUNTER — Ambulatory Visit: Payer: BC Managed Care – PPO | Admitting: Medical

## 2019-05-17 ENCOUNTER — Encounter: Payer: Self-pay | Admitting: Medical

## 2019-05-17 ENCOUNTER — Ambulatory Visit (HOSPITAL_BASED_OUTPATIENT_CLINIC_OR_DEPARTMENT_OTHER)
Admission: RE | Admit: 2019-05-17 | Discharge: 2019-05-17 | Disposition: A | Discharge status: Home / Self Care | Payer: BC Managed Care – PPO | Source: Ambulatory Visit | Attending: Medical | Admitting: Medical

## 2019-05-17 VITALS — BP 130/80 | HR 86 | Temp 96.2°F | Resp 18 | Ht 74.0 in | Wt 259.8 lb

## 2019-05-17 DIAGNOSIS — R030 Elevated blood-pressure reading, without diagnosis of hypertension: Secondary | ICD-10-CM

## 2019-05-17 DIAGNOSIS — Z125 Encounter for screening for malignant neoplasm of prostate: Secondary | ICD-10-CM

## 2019-05-17 DIAGNOSIS — K219 Gastro-esophageal reflux disease without esophagitis: Secondary | ICD-10-CM | POA: Diagnosis not present

## 2019-05-17 DIAGNOSIS — R252 Cramp and spasm: Secondary | ICD-10-CM

## 2019-05-17 DIAGNOSIS — Z1211 Encounter for screening for malignant neoplasm of colon: Secondary | ICD-10-CM

## 2019-05-17 DIAGNOSIS — R0789 Other chest pain: Secondary | ICD-10-CM

## 2019-05-17 DIAGNOSIS — M19041 Primary osteoarthritis, right hand: Secondary | ICD-10-CM

## 2019-05-17 DIAGNOSIS — R350 Frequency of micturition: Secondary | ICD-10-CM

## 2019-05-17 DIAGNOSIS — D179 Benign lipomatous neoplasm, unspecified: Secondary | ICD-10-CM

## 2019-05-17 LAB — POCT URINALYSIS DIPSTICK
Bilirubin, UA: NEGATIVE
Blood, UA: NEGATIVE
Glucose, UA: NEGATIVE
Ketones, UA: NEGATIVE
Leukocytes, UA: NEGATIVE
Nitrite, UA: NEGATIVE
Protein, UA: NEGATIVE
Spec Grav, UA: 1.01 (ref 1.010–1.025)
Urobilinogen, UA: 0.2 E.U./dL
pH, UA: 6 (ref 5.0–8.0)

## 2019-05-17 LAB — COMPREHENSIVE METABOLIC PANEL
ALT: 37 U/L (ref 0–53)
AST: 20 U/L (ref 0–37)
Albumin: 4.3 g/dL (ref 3.5–5.2)
Alkaline Phosphatase: 72 U/L (ref 39–117)
BUN: 12 mg/dL (ref 6–23)
CO2: 29 mEq/L (ref 19–32)
Calcium: 9.2 mg/dL (ref 8.4–10.5)
Chloride: 104 mEq/L (ref 96–112)
Creatinine, Ser: 0.73 mg/dL (ref 0.40–1.50)
GFR: 110.45 mL/min (ref >60.00)
Glucose, Bld: 86 mg/dL (ref 70–99)
Potassium: 3.9 mEq/L (ref 3.5–5.1)
Sodium: 140 mEq/L (ref 135–145)
Total Bilirubin: 0.5 mg/dL (ref 0.2–1.2)
Total Protein: 7.3 g/dL (ref 6.0–8.3)

## 2019-05-17 LAB — TROPONIN I (HIGH SENSITIVITY): High Sens Troponin I: 3 ng/L (ref 2–17)

## 2019-05-17 LAB — PSA: PSA: 0.69 ng/mL (ref 0.10–4.00)

## 2019-05-17 LAB — MAGNESIUM: Magnesium: 1.8 mg/dL (ref 1.5–2.5)

## 2019-05-17 NOTE — Progress Notes (Signed)
Subjective:    Patient ID: [name], male    DOB: 18-Jul-1960, 59 y.o.   MRN: [id number]  HPI   Pt in today for first time.   Pt used to be pt of Dr. Milly Jakob. His pcp retired. Pt was with [facility] and [facility] for a little bit.  Pt states he has some pain in chest on and off that comes and goes. Pt states in he was given b-blocker in the past but stopped April of 2016. Pt states had ablation procedure in 2016.Marland Kitchen He states since then his atrial flutter. improved 80-90%. After ablation pt wanted to get off of metoprolol.    Pt daily since 2016 has discomfort of different sorts which he has trouble describing but state like he know his heart is there. He states when he worries seems to be worse. Today in bathroom he twisted to put cup on shelf and had brief cramp sensation in chest but no classic cardiac type signs and symptoms on review.  Pt last aw cardiologist 07-10-2018.  [name] returns for evaluation of blood pressure.  He has noted his BP is 130-140s/80-90s.  He was taking a lot of Naproxen, but stopped this 2 weeks ago.  He has not had any significant change in his BP.  He works in Biomedical scientist outside.  He also plays ice hockey.  He does not have exertional chest pain or shortness of breath.  He does have "cramps" in his chest and abdomen after a long day at work or after being active.  Sometimes, it occurs with laying flat.  He denies paroxysmal nocturnal dyspnea, leg swelling, syncope.  He does snore.  Prior CV studies:   The following studies were reviewed today:  Stress echocardiogram 11/20/2017 Negative stress echo. no evidence of ischemia.  Echocardiogram 12/23/2014 Inferior hypokinesis, mild LVH, EF 50, mildly reduced RV SF  Echo 9/12:  mild LVH, EF 60-65%.   ETT-myoview  No ischemia.  Pt seeing cardiologist next Tuesday.    Pt has hx of gerd. Some recently but none in past 3 days. He uses famotadine and that does help a lot. Also when he eats better it  improves.   Pt mentions legs cramp and time and some upper ext and even in pectoralis area at times. Pt states in summer it is worse when he works in the heat. He sprays lawn in summer.   Frequent urination for 3-4 years. No recent increase of frequency.   Since holidays bp has been hovering around 140/90. Before holidays bp was 130/80  Review of Systems  Constitutional: Negative for chills, fatigue and fever.  HENT: Negative for congestion, drooling and ear discharge.   Respiratory: Negative for cough, chest tightness, shortness of breath and wheezing.   Cardiovascular: Negative for chest pain and palpitations.  Gastrointestinal: Negative for abdominal pain.  Genitourinary: Negative for dysuria, flank pain, frequency, penile swelling, scrotal swelling and urgency.  Musculoskeletal: Negative for back pain.       Rt thumb nodule.  Skin: Negative for rash.  Neurological: Negative for dizziness, speech difficulty, weakness, numbness and headaches.  Hematological: Negative for adenopathy.  Psychiatric/Behavioral: Negative for behavioral problems, confusion and suicidal ideas. The patient is not nervous/anxious.     Past Medical History:  Diagnosis Date  . GERD (gastroesophageal reflux disease)   . Obesity   . Paroxysmal atrial flutter ([place])   . PSVT (paroxysmal supraventricular tachycardia) (HCC)      Social History   Socioeconomic History  .  Marital status: Divorced    Spouse name: Not on file  . Number of children: Not on file  . Years of education: Not on file  . Highest education level: Not on file  Occupational History  . Occupation: Designer, [employer]  Tobacco Use  . Smoking status: Never Smoker  . Smokeless tobacco: Never Used  Substance and Sexual Activity  . Alcohol use: Yes    Alcohol/week: 7.0 standard drinks    Types: 2 Cans of beer, 1 Shots of liquor, 4 Glasses of wine per week    Comment: occasionally  . Drug use: No  . Sexual  activity: Not on file  Other Topics Concern  . Not on file  Social History Narrative  . Not on file   Social Determinants of Health   Financial Resource Strain:   . Difficulty of Paying Living Expenses: Not on file  Food Insecurity:   . Worried About Charity fundraiser in the Last Year: Not on file  . Ran Out of Food in the Last Year: Not on file  Transportation Needs:   . Lack of Transportation (Medical): Not on file  . Lack of Transportation (Non-Medical): Not on file  Physical Activity:   . Days of Exercise per Week: Not on file  . Minutes of Exercise per Session: Not on file  Stress:   . Feeling of Stress : Not on file  Social Connections:   . Frequency of Communication with Friends and Family: Not on file  . Frequency of Social Gatherings with Friends and Family: Not on file  . Attends Religious Services: Not on file  . Active Member of Clubs or Organizations: Not on file  . Attends Archivist Meetings: Not on file  . Marital Status: Not on file  Intimate Partner Violence:   . Fear of Current or Ex-Partner: Not on file  . Emotionally Abused: Not on file  . Physically Abused: Not on file  . Sexually Abused: Not on file    Past Surgical History:  Procedure Laterality Date  . ELECTROPHYSIOLOGIC STUDY N/A 05/02/2015   Procedure: A-Flutter Ablation;  Surgeon: Evans Lance, MD;  Location: [facility];  Service: Cardiovascular;  Laterality: N/A;  . right toe surgery      Family History  Problem Relation Age of Onset  . Aneurysm Mother 63       brain  . Congestive Heart Failure Father   . Diabetes Father     No Known Allergies  Current Outpatient Medications on File Prior to Visit  Medication Sig Dispense Refill  . acetaminophen (TYLENOL) 500 MG tablet Take 500 mg by mouth every 6 (six) hours as needed.    Marland Kitchen aspirin EC 81 MG tablet Take 1 tablet (81 mg total) by mouth daily. (Patient taking differently: Take 162 mg by mouth daily. ) 90 tablet 3     No current facility-administered medications on file prior to visit.    BP (!) 140/96 (BP Location: Left Arm, Patient Position: Sitting, Cuff Size: Large)   Pulse 86   Temp (!) 96.2 F (35.7 C) (Temporal)   Resp 18   Ht 6\' 2"  (1.88 m)   Wt 259 lb 12.8 oz (117.8 kg)   SpO2 98%   BMI 33.36 kg/m     Objective:   Physical Exam    General Mental Status- Alert. General Appearance- Not in acute distress.   Skin General: Color- Normal Color. Moisture-  Normal Moisture.  Neck Carotid Arteries- Normal color. Moisture- Normal Moisture. No carotid bruits. No JVD.  Chest and Lung Exam Auscultation: Breath Sounds:-Normal.  Cardiovascular Auscultation:Rythm- Regular. Murmurs & Other Heart Sounds:Auscultation of the heart reveals- No Murmurs.  Abdomen Inspection:-Inspeection Normal. Palpation/Percussion:Note:No mass. Palpation and Percussion of the abdomen reveal- Non Tender, Non Distended + BS, no rebound or guarding. Rt side abdomen- on palpation when in seated position can feel small lipoma like mass superficialy.  Neurologic Cranial Nerve exam:- CN III-XII intact(No nystagmus), symmetric smile. Strength:- 5/5 equal and symmetric strength both upper and lower extremities.  Rt hand- on inspection.mild swelling dip joint. No tendernss of warmth.      Assessment & Plan:  You report history of atypical transient chest pain/possible palpitations for years even since ablation procedure.  I would avoid caffeine beverages.  For safety sake will get troponin today.  Though since you had pain I daily for years I doubt this will come back positive.  Your EKG today shows normal sinus rhythm.   For history of increased urinary frequency, I sent to get urine culture and PSA.  For history of GERD recommend that she continue famotidine and healthy diet.  Could add proton pump inhibitor if this worsens.  For history of intermittent extremity cramping, recommend getting metabolic panel  and magnesium level today.  History of possible right side benign lipoma, would recommend follow this area and if it feels a little bit larger then can refer to general surgeon for evaluation and treatment.  I did go ahead and place GI referral for screening colonoscopy.  Right hand nodule type appearance, I did go ahead and placed x-ray order to see if there are osteoarthritic changes seen.  For elevated blood pressure has been borderline, I want you to check blood pressures consecutively as explained.  Today blood pressure on recheck was better so I think there might be some component today of whitecoat hypertension.  Follow-up in 2 to 3 weeks or as needed.  45 minutes spent with new pt. 50% of time spent counseling pt on plan going forward and answering pt questions.

## 2019-05-17 NOTE — Patient Instructions (Signed)
You report history of atypical transient chest pain/possible palpitations for years even since ablation procedure.  I would avoid caffeine beverages.  For safety sake will get troponin today.  Though since you had pain I daily for years I doubt this will come back positive.  Your EKG today shows normal sinus rhythm.   For history of increased urinary frequency, I sent to get urine culture and PSA.  For history of GERD recommend that she continue famotidine and healthy diet.  Could add proton pump inhibitor if this worsens.  For history of intermittent extremity cramping, recommend getting metabolic panel and magnesium level today.  History of possible right side benign lipoma, would recommend follow this area and if it feels a little bit larger then can refer to general surgeon for evaluation and treatment.  I did go ahead and place GI referral for screening colonoscopy.  Right hand nodule type appearance, I did go ahead and placed x-ray order to see if there are osteoarthritic changes seen.  For elevated blood pressure has been borderline, I want you to check blood pressures consecutively as explained.  Today blood pressure on recheck was better so I think there might be some component today of whitecoat hypertension.  Follow-up in 2 to 3 weeks or as needed.

## 2019-05-18 LAB — URINE CULTURE
MICRO NUMBER:: 10027861
Result:: NO GROWTH
SPECIMEN QUALITY:: ADEQUATE

## 2019-05-25 ENCOUNTER — Other Ambulatory Visit: Payer: Self-pay

## 2019-05-25 ENCOUNTER — Ambulatory Visit: Payer: BC Managed Care – PPO | Admitting: Internal Medicine

## 2019-05-25 ENCOUNTER — Encounter: Payer: Self-pay | Admitting: Internal Medicine

## 2019-05-25 VITALS — BP 118/80 | HR 77 | Ht 74.0 in | Wt 264.0 lb

## 2019-05-25 DIAGNOSIS — I471 Supraventricular tachycardia: Secondary | ICD-10-CM | POA: Diagnosis not present

## 2019-05-25 NOTE — Progress Notes (Signed)
HPI [name] returns today after a long absence from our EP clinic. He is a pleasant 59 yo man with a narrow QRS tachycardia who had evidence of both atrial flutter and SVT and underwent slow pathway ablation as well as atrial flutter isthmus ablation in 2017. He has a host of complaints today including muscle cramps in his abdomen which occur after he has done strenuous activity. The patient has also had problems with reflux. He has occaisional palpitations. No angina. He denies any sustained symptoms.  No Known Allergies   Current Outpatient Medications  Medication Sig Dispense Refill  . acetaminophen (TYLENOL) 500 MG tablet Take 500 mg by mouth every 6 (six) hours as needed.    Marland Kitchen aspirin EC 81 MG tablet Take 1 tablet (81 mg total) by mouth daily. (Patient taking differently: Take 162 mg by mouth daily. ) 90 tablet 3   No current facility-administered medications for this visit.     Past Medical History:  Diagnosis Date  . GERD (gastroesophageal reflux disease)   . Obesity   . Paroxysmal atrial flutter ([place])   . PSVT (paroxysmal supraventricular tachycardia) (H[facility])     ROS:   All systems reviewed and negative except as noted in the HPI.   Past Surgical History:  Procedure Laterality Date  . ELECTROPHYSIOLOGIC STUDY N/A 05/02/2015   Procedure: A-Flutter Ablation;  Surgeon: Evans Lance, MD;  Location: [facility];  Service: Cardiovascular;  Laterality: N/A;  . right toe surgery       Family History  Problem Relation Age of Onset  . Aneurysm Mother 20       brain  . Congestive Heart Failure Father   . Diabetes Father      Social History   Socioeconomic History  . Marital status: Divorced    Spouse name: Not on file  . Number of children: Not on file  . Years of education: Not on file  . Highest education level: Not on file  Occupational History  . Occupation: Designer, [employer]  Tobacco Use  . Smoking status: Never  Smoker  . Smokeless tobacco: Never Used  Substance and Sexual Activity  . Alcohol use: Yes    Alcohol/week: 7.0 standard drinks    Types: 2 Cans of beer, 1 Shots of liquor, 4 Glasses of wine per week    Comment: occasionally  . Drug use: No  . Sexual activity: Not on file  Other Topics Concern  . Not on file  Social History Narrative  . Not on file   Social Determinants of Health   Financial Resource Strain:   . Difficulty of Paying Living Expenses: Not on file  Food Insecurity:   . Worried About Charity fundraiser in the Last Year: Not on file  . Ran Out of Food in the Last Year: Not on file  Transportation Needs:   . Lack of Transportation (Medical): Not on file  . Lack of Transportation (Non-Medical): Not on file  Physical Activity:   . Days of Exercise per Week: Not on file  . Minutes of Exercise per Session: Not on file  Stress:   . Feeling of Stress : Not on file  Social Connections:   . Frequency of Communication with Friends and Family: Not on file  . Frequency of Social Gatherings with Friends and Family: Not on file  . Attends Religious Services: Not on file  . Active Member of  Clubs or Organizations: Not on file  . Attends Archivist Meetings: Not on file  . Marital Status: Not on file  Intimate Partner Violence:   . Fear of Current or Ex-Partner: Not on file  . Emotionally Abused: Not on file  . Physically Abused: Not on file  . Sexually Abused: Not on file     BP 118/80   Pulse 77   Ht 6\' 2"  (1.88 m)   Wt 264 lb (119.7 kg)   SpO2 95%   BMI 33.90 kg/m   Physical Exam:  Well appearing NAD HEENT: Unremarkable Neck:  No JVD, no thyromegally Lymphatics:  No adenopathy Back:  No CVA tenderness Lungs:  Clear with no wheezes HEART:  Regular rate rhythm, no murmurs, no rubs, no clicks Abd:  soft, positive bowel sounds, no organomegally, no rebound, no guarding Ext:  2 plus pulses, no edema, no cyanosis, no clubbing Skin:  No rashes no  nodules Neuro:  CN II through XII intact, motor grossly intact   Assess/Plan: 1. Palpitations - these are fairly well controlled. We discussed wearing a Zio monitor for 14 days but at this point he would like to wait and see if his symptoms worsen. 2. Abdominal cramps - these sound like muscular cramps and are not related to activity except that he will note that they occur hours after he has done strenuous work. We discussed the consumption of gatorade/sport drinks. 3. Acid reflux - I recommended he try maalox or an ant-acid first. If not controlled the omeprazole would be a consideration.  Mikle Bosworth.D

## 2019-05-25 NOTE — Patient Instructions (Addendum)
Medication Instructions:  Your physician recommends that you continue on your current medications as directed. Please refer to the Current Medication list given to you today.  Labwork: None ordered.  Testing/Procedures: None ordered.  Follow-Up: Your physician wants you to follow-up in: as needed with Dr. Taylor.      Any Other Special Instructions Will Be Listed Below (If Applicable).  If you need a refill on your cardiac medications before your next appointment, please call your pharmacy.   

## 2019-06-17 DIAGNOSIS — D235 Other benign neoplasm of skin of trunk: Secondary | ICD-10-CM | POA: Diagnosis not present

## 2019-06-17 DIAGNOSIS — D2262 Melanocytic nevi of left upper limb, including shoulder: Secondary | ICD-10-CM | POA: Diagnosis not present

## 2019-06-17 DIAGNOSIS — D1801 Hemangioma of skin and subcutaneous tissue: Secondary | ICD-10-CM | POA: Diagnosis not present

## 2019-06-17 DIAGNOSIS — D225 Melanocytic nevi of trunk: Secondary | ICD-10-CM | POA: Diagnosis not present

## 2019-06-17 DIAGNOSIS — L570 Actinic keratosis: Secondary | ICD-10-CM | POA: Diagnosis not present

## 2019-07-15 ENCOUNTER — Encounter: Payer: Self-pay | Admitting: Medical

## 2019-12-07 ENCOUNTER — Encounter: Payer: Self-pay | Admitting: Medical

## 2019-12-07 ENCOUNTER — Other Ambulatory Visit: Payer: Self-pay

## 2019-12-07 ENCOUNTER — Ambulatory Visit: Payer: BC Managed Care – PPO | Admitting: Medical

## 2019-12-07 VITALS — BP 128/80 | HR 74 | Temp 98.2°F | Resp 18 | Ht 74.0 in | Wt 260.0 lb

## 2019-12-07 DIAGNOSIS — R252 Cramp and spasm: Secondary | ICD-10-CM

## 2019-12-07 DIAGNOSIS — Z1152 Encounter for screening for COVID-19: Secondary | ICD-10-CM

## 2019-12-07 DIAGNOSIS — Z1211 Encounter for screening for malignant neoplasm of colon: Secondary | ICD-10-CM

## 2019-12-07 DIAGNOSIS — Z125 Encounter for screening for malignant neoplasm of prostate: Secondary | ICD-10-CM | POA: Diagnosis not present

## 2019-12-07 DIAGNOSIS — R109 Unspecified abdominal pain: Secondary | ICD-10-CM | POA: Diagnosis not present

## 2019-12-07 LAB — COMPREHENSIVE METABOLIC PANEL
ALT: 26 U/L (ref 0–53)
AST: 15 U/L (ref 0–37)
Albumin: 4.4 g/dL (ref 3.5–5.2)
Alkaline Phosphatase: 72 U/L (ref 39–117)
BUN: 15 mg/dL (ref 6–23)
CO2: 29 mEq/L (ref 19–32)
Calcium: 9.2 mg/dL (ref 8.4–10.5)
Chloride: 103 mEq/L (ref 96–112)
Creatinine, Ser: 0.72 mg/dL (ref 0.40–1.50)
GFR: 112 mL/min (ref >60.00)
Glucose, Bld: 96 mg/dL (ref 70–99)
Potassium: 4.1 mEq/L (ref 3.5–5.1)
Sodium: 137 mEq/L (ref 135–145)
Total Bilirubin: 0.6 mg/dL (ref 0.2–1.2)
Total Protein: 6.8 g/dL (ref 6.0–8.3)

## 2019-12-07 LAB — PSA: PSA: 0.51 ng/mL (ref 0.10–4.00)

## 2019-12-07 LAB — MAGNESIUM: Magnesium: 1.8 mg/dL (ref 1.5–2.5)

## 2019-12-07 LAB — LIPASE: Lipase: 11 U/L (ref 11.0–59.0)

## 2019-12-07 MED ORDER — FAMOTIDINE 20 MG PO TABS
20.0000 mg | ORAL_TABLET | Freq: Every day | ORAL | 0 refills | Status: DC
Start: 2019-12-07 — End: 2020-03-21

## 2019-12-07 NOTE — Progress Notes (Signed)
Subjective:    Patient ID: [name], male    DOB: Jan 10, 1961, 59 y.o.   MRN: [id number]  HPI   Pt in for follow up.  Pt mentioned that in past he was tested + for covid antibody. But blood donation center no longer are doing antibodies. He suspected he got covid in november  Pt states he has been dealing with muscle cramps this summer.   He notes also in past 3-4 months he has some diffuse soreness to abdomen. Notes the pain on twisting/thorax. Pt notes pain more on movement. States almost like when muscles sore doing to many sits. Pain not associated with exercise pattern. Pt mentions he will get scheduled for colonoscopy. No black stools. Pt states rare bright red blood when wipes and thinks hemorrhoid related.  No weight loss without effort. Some looser stools after he drinks regular mild. Better if drinks almond milk.  No pain in stomach after eating. No nausea or vomiting.  Pt had twice in past month severe heart burn after eating.     Review of Systems  Constitutional: Negative for chills, fatigue and fever.  Respiratory: Negative for cough, chest tightness, shortness of breath and wheezing.   Cardiovascular: Negative for chest pain and palpitations.  Gastrointestinal: Positive for abdominal pain. Negative for abdominal distention, constipation, rectal pain and vomiting.  Musculoskeletal: Negative for back pain.  Skin: Negative for rash.  Hematological: Negative for adenopathy.  Psychiatric/Behavioral: Negative for behavioral problems and confusion.    Past Medical History:  Diagnosis Date  . GERD (gastroesophageal reflux disease)   . Obesity   . Paroxysmal atrial flutter ([place])   . PSVT (paroxysmal supraventricular tachycardia) (HCC)      Social History   Socioeconomic History  . Marital status: Divorced    Spouse name: Not on file  . Number of children: Not on file  . Years of education: Not on file  . Highest education level: Not on file  Occupational  History  . Occupation: Designer, [employer]  Tobacco Use  . Smoking status: Never Smoker  . Smokeless tobacco: Never Used  Vaping Use  . Vaping Use: Never used  Substance and Sexual Activity  . Alcohol use: Yes    Alcohol/week: 7.0 standard drinks    Types: 2 Cans of beer, 1 Shots of liquor, 4 Glasses of wine per week    Comment: occasionally  . Drug use: No  . Sexual activity: Not on file  Other Topics Concern  . Not on file  Social History Narrative  . Not on file   Social Determinants of Health   Financial Resource Strain:   . Difficulty of Paying Living Expenses:   Food Insecurity:   . Worried About Charity fundraiser in the Last Year:   . Arboriculturist in the Last Year:   Transportation Needs:   . Film/video editor (Medical):   Marland Kitchen Lack of Transportation (Non-Medical):   Physical Activity:   . Days of Exercise per Week:   . Minutes of Exercise per Session:   Stress:   . Feeling of Stress :   Social Connections:   . Frequency of Communication with Friends and Family:   . Frequency of Social Gatherings with Friends and Family:   . Attends Religious Services:   . Active Member of Clubs or Organizations:   . Attends Archivist Meetings:   Marland Kitchen Marital Status:   Intimate Partner Violence:   .  Fear of Current or Ex-Partner:   . Emotionally Abused:   Marland Kitchen Physically Abused:   . Sexually Abused:     Past Surgical History:  Procedure Laterality Date  . ELECTROPHYSIOLOGIC STUDY N/A 05/02/2015   Procedure: A-Flutter Ablation;  Surgeon: Evans Lance, MD;  Location: [facility];  Service: Cardiovascular;  Laterality: N/A;  . right toe surgery      Family History  Problem Relation Age of Onset  . Aneurysm Mother 52       brain  . Congestive Heart Failure Father   . Diabetes Father     No Known Allergies  Current Outpatient Medications on File Prior to Visit  Medication Sig Dispense Refill  . acetaminophen (TYLENOL) 500  MG tablet Take 500 mg by mouth every 6 (six) hours as needed. (Patient not taking: Reported on 12/07/2019)    . aspirin EC 81 MG tablet Take 1 tablet (81 mg total) by mouth daily. (Patient not taking: Reported on 12/07/2019) 90 tablet 3   No current facility-administered medications on file prior to visit.    BP 128/80   Pulse 74   Temp 98.2 F (36.8 C) (Oral)   Resp 18   Ht 6\' 2"  (1.88 m)   Wt 260 lb (117.9 kg)   SpO2 97%   BMI 33.38 kg/m       Objective:   Physical Exam  General Mental Status- Alert. General Appearance- Not in acute distress.   Skin General: Color- Normal Color. Moisture- Normal Moisture.  Neck Carotid Arteries- Normal color. Moisture- Normal Moisture. No carotid bruits. No JVD.  Chest and Lung Exam Auscultation: Breath Sounds:-Normal.  Cardiovascular Auscultation:Rythm- Regular. Murmurs & Other Heart Sounds:Auscultation of the heart reveals- No Murmurs.  Abdomen Inspection:-Inspeection Normal. Palpation/Percussion:Note:No mass. Palpation and Percussion of the abdomen reveal- Non Tender, Non Distended + BS, no rebound or guarding.    Neurologic Cranial Nerve exam:- CN III-XII intact(No nystagmus), symmetric smile. Strength:- 5/5 equal and symmetric strength both upper and lower extremities.     Assessment & Plan:  You describe recent diffuse soreness type sensation to abdomen.  This description is a bit atypical.  We will do work-up to include CBC, CMP, lipase and a PSA.  Also decided to go ahead and refer you again to gastroenterologist as you already as you decide to get the colonoscopy.  Recent episodes of intermittent severe heartburn.  So added H. pylori breath test to your labs today.  Will prescribe famotidine to use if you have recurrent heartburn again.  Eat healthy diet and avoid foods and beverages that can cause reflux.  Gastroenterologist will decide if you might need EGD.  Recent episodes of intermittent cramping.  Will check your  metabolic panel and magnesium level.  Follow-up date to be determined after lab review.  Recommend coming in before GI appointment if you have any worsening or changing abdomen discomfort.  ED severe  type pain then recommend ED evaluation.  Mackie Pai, PA-C   Time spent with patient today was 30  minutes which consisted of chart rediew, discussing diagnosis, work up, treatment and documentation.

## 2019-12-07 NOTE — Patient Instructions (Signed)
You describe recent diffuse soreness type sensation to abdomen.  This description is a bit atypical.  We will do work-up to include CBC, CMP, lipase and a PSA.  Also decided to go ahead and refer you again to gastroenterologist as you already as you decide to get the colonoscopy.  Recent episodes of intermittent severe heartburn.  So added H. pylori breath test to your labs today.  Will prescribe famotidine to use if you have recurrent heartburn again.  Eat healthy diet and avoid foods and beverages that can cause reflux.  Gastroenterologist will decide if you might need EGD.  Recent episodes of intermittent cramping.  Will check your metabolic panel and magnesium level.  Follow-up date to be determined after lab review.  Recommend coming in before GI appointment if you have any worsening or changing abdomen discomfort.  ED severe  type pain then recommend ED evaluation.

## 2019-12-08 ENCOUNTER — Encounter: Payer: Self-pay | Admitting: Physician Assistant

## 2019-12-08 LAB — CBC WITH DIFFERENTIAL/PLATELET
Basophils Absolute: 0 10*3/uL (ref 0.0–0.1)
Basophils Relative: 0.6 % (ref 0.0–3.0)
Eosinophils Absolute: 0.1 10*3/uL (ref 0.0–0.7)
Eosinophils Relative: 1.7 % (ref 0.0–5.0)
HCT: 43.6 % (ref 39.0–52.0)
Hemoglobin: 14.9 g/dL (ref 13.0–17.0)
Lymphocytes Relative: 43.8 % (ref 12.0–46.0)
Lymphs Abs: 2.7 10*3/uL (ref 0.7–4.0)
MCHC: 34.1 g/dL (ref 30.0–36.0)
MCV: 90.3 fl (ref 78.0–100.0)
Monocytes Absolute: 0.5 10*3/uL (ref 0.1–1.0)
Monocytes Relative: 8 % (ref 3.0–12.0)
Neutro Abs: 2.8 10*3/uL (ref 1.4–7.7)
Neutrophils Relative %: 45.9 % (ref 43.0–77.0)
Platelets: 194 10*3/uL (ref 150.0–400.0)
RBC: 4.83 Mil/uL (ref 4.22–5.81)
RDW: 13 % (ref 11.5–15.5)
WBC: 6.1 10*3/uL (ref 4.0–10.5)

## 2019-12-08 LAB — SARS-COV-2 ANTIBODY(IGG)SPIKE,SEMI-QUANTITATIVE: SARS COV1 AB(IGG)SPIKE,SEMI QN: 4.76 index — ABNORMAL HIGH (ref <1.00)

## 2020-01-21 ENCOUNTER — Encounter: Payer: Self-pay | Admitting: Physician Assistant

## 2020-01-21 ENCOUNTER — Ambulatory Visit: Payer: BC Managed Care – PPO | Admitting: Physician Assistant

## 2020-01-21 VITALS — BP 120/76 | HR 67 | Ht 74.0 in | Wt 250.0 lb

## 2020-01-21 DIAGNOSIS — R109 Unspecified abdominal pain: Secondary | ICD-10-CM | POA: Diagnosis not present

## 2020-01-21 DIAGNOSIS — Z1211 Encounter for screening for malignant neoplasm of colon: Secondary | ICD-10-CM

## 2020-01-21 DIAGNOSIS — K219 Gastro-esophageal reflux disease without esophagitis: Secondary | ICD-10-CM | POA: Diagnosis not present

## 2020-01-21 MED ORDER — NA SULFATE-K SULFATE-MG SULF 17.5-3.13-1.6 GM/177ML PO SOLN: 1.0000 | Freq: Once | ORAL | 0 refills | Status: AC

## 2020-01-21 NOTE — Progress Notes (Signed)
Attending Physician's Attestation   I have reviewed the chart.   I agree with the Advanced Practitioner's note, impression, and recommendations with any updates as below.    Ashanty Coltrane Mansouraty, MD [facility] Office # [phone]  

## 2020-01-21 NOTE — Progress Notes (Signed)
Subjective:    Patient ID: [name], male    DOB: 09/02/1960, 59 y.o.   MRN: [id number]  HPI [name]" is a pleasant 59 year old white male, new to GI today referred by Ed Saguier PA-C for evaluation of recent abdominal pain and also to discuss colonoscopy. Patient has not had any prior GI evaluation.  He has history of PSVT, hypertension and prior atrial flutter. Patient says that he has had occasional issues with heartburn and acid reflux particularly if he eats late or drinks alcohol.  He had 2 episodes this summer after eating very late, waking up with heartburn and indigestion.  He has been eating better since and earlier and has not been having any ongoing symptoms.  He says occasionally he will have some regurgitation after meals but not necessarily acidic.  He uses Pepcid as needed which is infrequent.  He does not feel that he needs an acid blocker regularly. He has been noticing some abdominal discomfort and what he describes as some tightness in his abdomen and soreness intermittently on the right side.  He says generally in the summer over the past 4 to 5 years he will have issues with cramping which can include cramping in his torso and also in his extremities.  He says he works outside and feels that the heat has something to do with this. He has not had any recent changes in his bowel habits, no melena or hematochezia.  Very rarely may see a small amount of blood on the tissue particularly if he has been stressed.  He is randomly had some sharp pains in his mid right and mid left abdomen. Recent labs including CBC chemistries and PSA all within normal limits. No family history of colon cancer or polyps that he is aware of.  He had a cousin not too long ago died from what sounds like ischemic bowel.  Review of Systems Pertinent positive and negative review of systems were noted in the above HPI section.  All other review of systems was otherwise negative.  Outpatient  Encounter Medications as of 01/21/2020  Medication Sig  . acetaminophen (TYLENOL) 500 MG tablet Take 500 mg by mouth every 6 (six) hours as needed. (Patient not taking: Reported on 12/07/2019)  . aspirin EC 81 MG tablet Take 1 tablet (81 mg total) by mouth daily. (Patient not taking: Reported on 12/07/2019)  . famotidine (PEPCID) 20 MG tablet Take 1 tablet (20 mg total) by mouth daily.  . Na Sulfate-K Sulfate-Mg Sulf 17.5-3.13-1.6 GM/177ML SOLN Take 1 kit by mouth once for 1 dose.   No facility-administered encounter medications on file as of 01/21/2020.   No Known Allergies Patient Active Problem List   Diagnosis Date Noted  . Atrial flutter ([place]) 05/02/2015  . Atrial flutter with rapid ventricular response ([place]) 12/23/2014  . Palpitations 12/23/2014  . Snoring   . Erectile dysfunction 05/13/2013  . Chest pain 03/05/2011  . Shortness of breath 03/05/2011  . PSVT (paroxysmal supraventricular tachycardia) ([facility]) 02/05/2011  . Hypertension 02/05/2011   Social History   Socioeconomic History  . Marital status: Married    Spouse name: Not on file  . Number of children: 1  . Years of education: Not on file  . Highest education level: Not on file  Occupational History  . Occupation: Designer, [employer]  Tobacco Use  . Smoking status: Never Smoker  . Smokeless tobacco: Never Used  Vaping Use  . Vaping Use:  Never used  Substance and Sexual Activity  . Alcohol use: Yes    Alcohol/week: 7.0 standard drinks    Types: 2 Cans of beer, 1 Shots of liquor, 4 Glasses of wine per week    Comment: occasionally  . Drug use: No  . Sexual activity: Not on file  Other Topics Concern  . Not on file  Social History Narrative  . Not on file   Social Determinants of Health   Financial Resource Strain:   . Difficulty of Paying Living Expenses: Not on file  Food Insecurity:   . Worried About Charity fundraiser in the Last Year: Not on file  . Ran Out of Food in the Last  Year: Not on file  Transportation Needs:   . Lack of Transportation (Medical): Not on file  . Lack of Transportation (Non-Medical): Not on file  Physical Activity:   . Days of Exercise per Week: Not on file  . Minutes of Exercise per Session: Not on file  Stress:   . Feeling of Stress : Not on file  Social Connections:   . Frequency of Communication with Friends and Family: Not on file  . Frequency of Social Gatherings with Friends and Family: Not on file  . Attends Religious Services: Not on file  . Active Member of Clubs or Organizations: Not on file  . Attends Archivist Meetings: Not on file  . Marital Status: Not on file  Intimate Partner Violence:   . Fear of Current or Ex-Partner: Not on file  . Emotionally Abused: Not on file  . Physically Abused: Not on file  . Sexually Abused: Not on file    [name]'s family history includes Aneurysm (age of onset: 56) in his mother; Congestive Heart Failure in his father; Diabetes in his father; Other in his cousin.      Objective:    Vitals:   01/21/20 1048  BP: 120/76  Pulse: 67    Physical Exam Well-developed well-nourished WM  in no acute distress.  Height, Weight250 , BMI32  HEENT; nontraumatic normocephalic, EOMI, PER R LA, sclera anicteric. Oropharynx;not done Neck; supple, no JVD Cardiovascular; regular rate and rhythm with S1-S2, no murmur rub or gallop Pulmonary; Clear bilaterally Abdomen; soft, nontender, nondistended, no palpable mass or hepatosplenomegaly, bowel sounds are active Rectal;not done today  Skin; benign exam, no jaundice rash or appreciable lesions Extremities; no clubbing cyanosis or edema skin warm and dry Neuro/Psych; alert and oriented x4, grossly nonfocal mood and affect appropriate       Assessment & Plan:   #58 59 year old white male referred for colon cancer screening, no prior colonoscopy, average risk #2 intermittent GERD, rule out Barrett's #3  4 to 77-monthhistory of  intermittent abdominal cramping, somewhat migratory, more common in the right abdomen.  I suspect this is actually musculoskeletal of etiology as he has had intermittent issues with cramping in his extremities usually associated with working outdoors. #4 hypertension 5.  History of atrial flutter  Plan; Patient will be scheduled for colonoscopy and EGD with Dr. MRush Landmark  Both procedures were discussed in detail with the patient including indications risks and benefits and he is agreeable to proceed. Patient has not completed COVID-19 vaccination, he had Covid in November 2020. Patient will continue with lifestyle management/dietary management of intermittent GERD symptoms.  Loy Mccartt SGenia HaroldPA-C 01/21/2020   Cc: SMackie Pai PA-C

## 2020-01-21 NOTE — Patient Instructions (Signed)
If you are age 58 or older, your body mass index should be between 23-30. Your Body mass index is 32.1 kg/m. If this is out of the aforementioned range listed, please consider follow up with your Primary Care Provider.  If you are age 61 or younger, your body mass index should be between 19-25. Your Body mass index is 32.1 kg/m. If this is out of the aformentioned range listed, please consider follow up with your Primary Care Provider.   You have been scheduled for an endoscopy and colonoscopy. Please follow the written instructions given to you at your visit today. Please pick up your prep supplies at the pharmacy within the next 1-3 days. If you use inhalers (even only as needed), please bring them with you on the day of your procedure.  Follow up pending the results of you Endoscopy/Colonoscopy.

## 2020-03-13 ENCOUNTER — Other Ambulatory Visit: Payer: Self-pay

## 2020-03-13 ENCOUNTER — Ambulatory Visit: Payer: BC Managed Care – PPO | Admitting: Medical

## 2020-03-13 VITALS — BP 128/83 | HR 84 | Temp 98.3°F | Resp 18 | Ht 74.0 in | Wt 253.8 lb

## 2020-03-13 DIAGNOSIS — H6121 Impacted cerumen, right ear: Secondary | ICD-10-CM | POA: Diagnosis not present

## 2020-03-13 DIAGNOSIS — H9201 Otalgia, right ear: Secondary | ICD-10-CM

## 2020-03-13 DIAGNOSIS — H60501 Unspecified acute noninfective otitis externa, right ear: Secondary | ICD-10-CM | POA: Diagnosis not present

## 2020-03-13 MED ORDER — NEOMYCIN-POLYMYXIN-HC 3.5-10000-1 OT SOLN: 3.0000 [drp] | Freq: Four times a day (QID) | OTIC | 0 refills | Status: DC

## 2020-03-13 MED ORDER — AZITHROMYCIN 250 MG PO TABS: ORAL_TABLET | ORAL | 0 refills | Status: DC

## 2020-03-13 NOTE — Progress Notes (Signed)
Subjective:    Patient ID: [name], male    DOB: 02-05-1961, 59 y.o.   MRN: [id number]  HPI  Pt in with rt ear feeling blocked since friday. Also soreness about one week ago. Pt also mentioned he had bad smell to area. He state like zit inside canal.   No fever, no chills and no sweats.  Mild pain below rt ear.    Review of Systems  Constitutional: Negative for chills, fatigue and fever.  Respiratory: Negative for cough, chest tightness, shortness of breath and wheezing.   Cardiovascular: Negative for chest pain and palpitations.  Gastrointestinal: Negative for abdominal pain, blood in stool, constipation and nausea.  Genitourinary: Negative for dysuria.  Musculoskeletal: Negative for back pain and myalgias.  Neurological: Negative for dizziness and headaches.  Hematological: Negative for adenopathy. Does not bruise/bleed easily.  Psychiatric/Behavioral: Negative for behavioral problems.    Past Medical History:  Diagnosis Date   GERD (gastroesophageal reflux disease)    Obesity    Paroxysmal atrial flutter ([facility])    PSVT (paroxysmal supraventricular tachycardia) ([facility])      Social History   Socioeconomic History   Marital status: Married    Spouse name: Not on file   Number of children: 1   Years of education: Not on file   Highest education level: Not on file  Occupational History   Occupation: lawn care    Employer: [employer]  Tobacco Use   Smoking status: Never Smoker   Smokeless tobacco: Never Used  Scientific laboratory technician Use: Never used  Substance and Sexual Activity   Alcohol use: Yes    Alcohol/week: 7.0 standard drinks    Types: 2 Cans of beer, 1 Shots of liquor, 4 Glasses of wine per week    Comment: occasionally   Drug use: No   Sexual activity: Not on file  Other Topics Concern   Not on file  Social History Narrative   Not on file   Social Determinants of Health   Financial Resource Strain:    Difficulty of  Paying Living Expenses: Not on file  Food Insecurity:    Worried About Running Out of Food in the Last Year: Not on file   Ran Out of Food in the Last Year: Not on file  Transportation Needs:    Lack of Transportation (Medical): Not on file   Lack of Transportation (Non-Medical): Not on file  Physical Activity:    Days of Exercise per Week: Not on file   Minutes of Exercise per Session: Not on file  Stress:    Feeling of Stress : Not on file  Social Connections:    Frequency of Communication with Friends and Family: Not on file   Frequency of Social Gatherings with Friends and Family: Not on file   Attends Religious Services: Not on file   Active Member of Clubs or Organizations: Not on file   Attends Archivist Meetings: Not on file   Marital Status: Not on file  Intimate Partner Violence:    Fear of Current or Ex-Partner: Not on file   Emotionally Abused: Not on file   Physically Abused: Not on file   Sexually Abused: Not on file    Past Surgical History:  Procedure Laterality Date   ELECTROPHYSIOLOGIC STUDY N/A 05/02/2015   Procedure: A-Flutter Ablation;  Surgeon: Evans Lance, MD;  Location: [facility];  Service: Cardiovascular;  Laterality: N/A;   right toe surgery  2012    Family History  Problem Relation Age of Onset   Aneurysm Mother 14       brain   Congestive Heart Failure Father    Diabetes Father    Other Cousin        abdominal pain, "dead colon"   Colon cancer Neg Hx     No Known Allergies  Current Outpatient Medications on File Prior to Visit  Medication Sig Dispense Refill   aspirin EC 81 MG tablet Take 1 tablet (81 mg total) by mouth daily. 90 tablet 3   acetaminophen (TYLENOL) 500 MG tablet Take 500 mg by mouth every 6 (six) hours as needed. (Patient not taking: Reported on 12/07/2019)     famotidine (PEPCID) 20 MG tablet Take 1 tablet (20 mg total) by mouth daily. (Patient not taking: Reported on  03/13/2020) 30 tablet 0   No current facility-administered medications on file prior to visit.    BP 128/83    Pulse 84    Temp 98.3 F (36.8 C)    Resp 18    Ht 6\' 2"  (1.88 m)    Wt 253 lb 12.8 oz (115.1 kg)    SpO2 99%    BMI 32.59 kg/m       Objective:   Physical Exam  General  Mental Status - Alert. General Appearance - Well groomed. Not in acute distress.  Skin Rashes- No Rashes.  HEENT Head- Normal. Ear Auditory Canal - Left- Normal. Right - small amount of wax present but blocking view of tmTympanic Membrane- Left- Normal. Right- can't see tm. Eye Sclera/Conjunctiva- Left- Normal. Right- Normal. Nose & Sinuses Nasal Mucosa- Left-  Boggy and Congested. Right-  Boggy and Congested.Bilateral  nomaxillary and  No frontal sinus pressure.   Neck Neck- Supple. No Masses.   Chest and Lung Exam Auscultation: Breath Sounds:-Clear even and unlabored.  Cardiovascular Auscultation:Rythm- Regular, rate and rhythm. Murmurs & Other Heart Sounds:Ausculatation of the heart reveal- No Murmurs.  Lymphatic Head & Neck General Head & Neck Lymphatics: Bilateral: Description- No Localized lymphadenopathy.      Assessment & Plan:  For rt ear pain and cerumen impaction I prescribed cortisporin otic drops and azithromycin oral antibiotic.  With ear pain and odor will treat for otitis externa and possible infection tympanic membrane.  Once a day apply debrox over the counter for about 15-20 minutes as discussed.  Follow up on Friday for ear lavage attempt. If no successful and symptoms persist then refer to ENT.   Mackie Pai, PA-C   Time spent with patient today was 20 minutes which consisted of chart review, discussing diagnosis, work up treatment and documentation.

## 2020-03-13 NOTE — Patient Instructions (Signed)
For rt ear pain and cerumen impaction I prescribed cortisporin otic drops and azithromycin oral antibiotic.  With ear pain and odor will treat for otitis externa and possible infection tympanic membrane.  Once a day apply debrox over the counter for about 15-20 minutes as discussed.  Follow up on Friday for ear lavage attempt. If no successful and symptoms persist then refer to ENT.

## 2020-03-17 ENCOUNTER — Ambulatory Visit (INDEPENDENT_AMBULATORY_CARE_PROVIDER_SITE_OTHER): Payer: BC Managed Care – PPO

## 2020-03-17 ENCOUNTER — Other Ambulatory Visit: Payer: Self-pay

## 2020-03-17 ENCOUNTER — Ambulatory Visit: Payer: BC Managed Care – PPO | Admitting: Medical

## 2020-03-17 ENCOUNTER — Other Ambulatory Visit: Payer: Self-pay | Admitting: Gastroenterology

## 2020-03-17 VITALS — BP 111/70 | HR 70 | Resp 18 | Ht 74.0 in | Wt 248.0 lb

## 2020-03-17 DIAGNOSIS — Z8679 Personal history of other diseases of the circulatory system: Secondary | ICD-10-CM

## 2020-03-17 DIAGNOSIS — H6121 Impacted cerumen, right ear: Secondary | ICD-10-CM

## 2020-03-17 DIAGNOSIS — Z1159 Encounter for screening for other viral diseases: Secondary | ICD-10-CM

## 2020-03-17 DIAGNOSIS — Z8639 Personal history of other endocrine, nutritional and metabolic disease: Secondary | ICD-10-CM

## 2020-03-17 NOTE — Progress Notes (Signed)
   Subjective:    Patient ID: [name], male    DOB: 1960-12-13, 59 y.o.   MRN: [id number]  HPI  Patient is in today for follow-up.  He had some right ear discomfort and on exam he had wax blocking the left tympanic membrane.  Decided to give him Cortisporin otic drops and azithromycin antibiotic since could not visualize the TM.  Patient did use both meds but he forgot to use the Debrox.  No pain now but ear still feels blocked.  Also he has history of atrial flutter and since ablation some intermittent rare episodes of PSVT.  Patient has questions regarding his resting heart rate which is usually in the 70s but when he was younger he notes resting heart rate was more in the 60s.  He is not having any chest pain or palpitation.  Also he has a history of high cholesterol and no recent lipid panel.  He has history of Covid and also wants to recheck antibodies.     Review of Systems     Objective:   Physical Exam  .Mental Status - Alert. General Appearance - Well groomed. Not in acute distress.  Skin Rashes- No Rashes.  HEENT Head- Normal. Ear Auditory Canal - Left- Normal. Right - small amount of wax present but blocking view of tmTympanic Membrane- Left- Normal. Right- can't see tm. Post lavage right ear canal clear completely.  TM slightly pink but no obvious infection.  Eye Sclera/Conjunctiva- Left- Normal. Right- Normal. Nose & Sinuses Nasal Mucosa- Left-  Boggy and Congested. Right-  Boggy and Congested.Bilateral  nomaxillary and  No frontal sinus pressure.   Neck Neck- Supple. No Masses.   Chest and Lung Exam Auscultation: Breath Sounds:-Clear even and unlabored.  Cardiovascular Auscultation:Rythm- Regular, rate and rhythm. Murmurs & Other Heart Sounds:Ausculatation of the heart reveal- No Murmurs.  Lymphatic Head & Neck General Head & Neck Lymphatics: Bilateral: Description- No Localized lymphadenopathy.      Assessment & Plan:  Right ear  pain resolved.  Wax cleared by lavage.  No obvious wax present now.  History of atrial flutter and intermittent PSVT since ablation.  Advised patient that his current pulse checks appear normal.  But he could get smart watch and continue intermittent checks.  If pulse is over 100 let us know.  Could refer back to his cardiologist.  History of hyperlipidemia.  Did encourage to get wellness exam for fasting labs late December or early January.  Also could include Covid antibody studies at that time.  Follow-up wellness exam in December or January.  Otherwise as needed.

## 2020-03-17 NOTE — Patient Instructions (Signed)
Right ear pain resolved.  Wax cleared by lavage.  No obvious wax present now.  History of atrial flutter and intermittent PSVT since ablation.  Advised patient that his current pulse checks appear normal.  But he could get smart watch and continue intermittent checks.  If pulse is over 100 let us know.  Could refer back to his cardiologist.  History of hyperlipidemia.  Did encourage to get wellness exam for fasting labs late December or early January.  Also could include Covid antibody studies at that time.  Follow-up wellness exam in December or January.  Otherwise as needed.

## 2020-03-18 LAB — SARS CORONAVIRUS 2 (TAT 6-24 HRS): SARS Coronavirus 2: NEGATIVE

## 2020-03-21 ENCOUNTER — Other Ambulatory Visit: Payer: Self-pay

## 2020-03-21 ENCOUNTER — Ambulatory Visit (AMBULATORY_SURGERY_CENTER): Payer: BC Managed Care – PPO | Admitting: Gastroenterology

## 2020-03-21 ENCOUNTER — Encounter: Payer: Self-pay | Admitting: Gastroenterology

## 2020-03-21 VITALS — BP 124/73 | HR 61 | Temp 97.8°F | Resp 14

## 2020-03-21 DIAGNOSIS — D122 Benign neoplasm of ascending colon: Secondary | ICD-10-CM | POA: Diagnosis not present

## 2020-03-21 DIAGNOSIS — R109 Unspecified abdominal pain: Secondary | ICD-10-CM

## 2020-03-21 DIAGNOSIS — D124 Benign neoplasm of descending colon: Secondary | ICD-10-CM

## 2020-03-21 DIAGNOSIS — Z1211 Encounter for screening for malignant neoplasm of colon: Secondary | ICD-10-CM

## 2020-03-21 DIAGNOSIS — K449 Diaphragmatic hernia without obstruction or gangrene: Secondary | ICD-10-CM | POA: Diagnosis not present

## 2020-03-21 DIAGNOSIS — D123 Benign neoplasm of transverse colon: Secondary | ICD-10-CM | POA: Diagnosis not present

## 2020-03-21 DIAGNOSIS — K219 Gastro-esophageal reflux disease without esophagitis: Secondary | ICD-10-CM | POA: Diagnosis not present

## 2020-03-21 HISTORY — PX: UPPER GASTROINTESTINAL ENDOSCOPY: SHX188

## 2020-03-21 HISTORY — PX: COLONOSCOPY: SHX174

## 2020-03-21 MED ORDER — DICYCLOMINE HCL 10 MG PO CAPS: 10.0000 mg | ORAL_CAPSULE | Freq: Three times a day (TID) | ORAL | 0 refills | Status: DC | PRN

## 2020-03-21 MED ORDER — OMEPRAZOLE 40 MG PO CPDR
40.0000 mg | DELAYED_RELEASE_CAPSULE | Freq: Every day | ORAL | 4 refills | Status: DC
Start: 2020-03-21 — End: 2023-03-06

## 2020-03-21 MED ORDER — SODIUM CHLORIDE 0.9 % IV SOLN: 500.0000 mL | Freq: Once | INTRAVENOUS | Status: DC

## 2020-03-21 NOTE — Progress Notes (Signed)
Pt's states no medical or surgical changes since previsit or office visit.  ° °Vitals CW °

## 2020-03-21 NOTE — Progress Notes (Signed)
Called to room to assist during endoscopic procedure.  Patient ID and intended procedure confirmed with present staff. Received instructions for my participation in the procedure from the performing physician.  

## 2020-03-21 NOTE — Progress Notes (Signed)
To PACU, VSS. Report to [email or web address] 

## 2020-03-21 NOTE — Op Note (Signed)
[facility] Patient Name: [name] Procedure Date: 03/21/2020 8:57 AM MRN: [id number] Endoscopist: Justice Britain , MD Age: 59 Referring MD:  Date of Birth: 18-Apr-1961 Gender: Male Account #: [id number] Procedure:                Upper GI endoscopy Indications:              Generalized abdominal distress, Heartburn,                            Screening for Barrett's esophagus in patient at                            risk for this condition Medicines:                Monitored Anesthesia Care Procedure:                Pre-Anesthesia Assessment:                           - Prior to the procedure, a History and Physical                            was performed, and patient medications and                            allergies were reviewed. The patient's tolerance of                            previous anesthesia was also reviewed. The risks                            and benefits of the procedure and the sedation                            options and risks were discussed with the patient.                            All questions were answered, and informed consent                            was obtained. Prior Anticoagulants: The patient has                            taken no previous anticoagulant or antiplatelet                            agents except for aspirin. ASA Grade Assessment: II                            - A patient with mild systemic disease. After                            reviewing the risks and benefits, the patient was  deemed in satisfactory condition to undergo the                            procedure.                           After obtaining informed consent, the endoscope was                            passed under direct vision. Throughout the                            procedure, the patient's blood pressure, pulse, and                            oxygen saturations were monitored continuously. The                             Endoscope was introduced through the mouth, and                            advanced to the second part of duodenum. The upper                            GI endoscopy was accomplished without difficulty.                            The patient tolerated the procedure. Scope In: Scope Out: Findings:                 No gross lesions were noted in the proximal                            esophagus and in the mid esophagus.                           LA Grade C (one or more mucosal breaks continuous                            between tops of 2 or more mucosal folds, less than                            75% circumference) esophagitis with no bleeding was                            found in the distal esophagus.                           Two tongues of salmon-colored mucosa were present                            from 39 to 40 cm. No other visible abnormalities  were present. Biopsies were taken with a cold                            forceps for histology to rule out Barrett's                            esophagus.                           A 3 cm hiatal hernia was present.                           Patchy mildly erythematous mucosa without bleeding                            was found in the gastric body.                           No other gross lesions were noted in the entire                            examined stomach. Biopsies were taken with a cold                            forceps for histology and Helicobacter pylori                            testing.                           No gross lesions were noted in the duodenal bulb,                            in the first portion of the duodenum and in the                            second portion of the duodenum. Biopsies were taken                            with a cold forceps for histology. Complications:            No immediate complications. Estimated Blood Loss:     Estimated blood loss was  minimal. Impression:               - No gross lesions in esophagus proximally. LA                            Grade C esophagitis with no bleeding distally.                           - Salmon-colored mucosa suspicious for                            short-segment Barrett's esophagus. Biopsied.                           -  3 cm hiatal hernia.                           - Erythematous mucosa in the gastric body. No other                            gross lesions in the stomach. Biopsied.                           - No gross lesions in the duodenal bulb, in the                            first portion of the duodenum and in the second                            portion of the duodenum. Biopsied. Recommendation:           - Proceed to scheduled colonoscopy.                           - Start Omeprazole 40 mg daily.                           - Continue present medications.                           - Await pathology results.                           - Repeat upper endoscopy in 4 months to check                            healing.                           - The findings and recommendations were discussed                            with the patient. Justice Britain, MD 03/21/2020 9:50:03 AM

## 2020-03-21 NOTE — Op Note (Signed)
[facility] Patient Name: [name] Procedure Date: 03/21/2020 8:57 AM MRN: [id number] Endoscopist: Justice Britain , MD Age: 59 Referring MD:  Date of Birth: 07/26/1960 Gender: Male Account #: [id number] Procedure:                Colonoscopy Indications:              Screening for colorectal malignant neoplasm, This                            is the patient's first colonoscopy Medicines:                Monitored Anesthesia Care Procedure:                Pre-Anesthesia Assessment:                           - Prior to the procedure, a History and Physical                            was performed, and patient medications and                            allergies were reviewed. The patient's tolerance of                            previous anesthesia was also reviewed. The risks                            and benefits of the procedure and the sedation                            options and risks were discussed with the patient.                            All questions were answered, and informed consent                            was obtained. Prior Anticoagulants: The patient has                            taken no previous anticoagulant or antiplatelet                            agents except for aspirin. ASA Grade Assessment: II                            - A patient with mild systemic disease. After                            reviewing the risks and benefits, the patient was                            deemed in satisfactory condition to undergo the  procedure.                           After obtaining informed consent, the colonoscope                            was passed under direct vision. Throughout the                            procedure, the patient's blood pressure, pulse, and                            oxygen saturations were monitored continuously. The                            Colonoscope was introduced through the anus and                             advanced to the 5 cm into the ileum. The                            colonoscopy was performed without difficulty. The                            patient tolerated the procedure. The quality of the                            bowel preparation was adequate. The terminal ileum,                            ileocecal valve, appendiceal orifice, and rectum                            were photographed. Scope In: 9:25:24 AM Scope Out: 9:42:47 AM Scope Withdrawal Time: 0 hours 14 minutes 18 seconds  Total Procedure Duration: 0 hours 17 minutes 23 seconds  Findings:                 The digital rectal exam findings include                            hemorrhoids. Pertinent negatives include no                            palpable rectal lesions.                           The terminal ileum and ileocecal valve appeared                            normal.                           Four sessile and semi-sessile polyps were found in  the descending colon, ascending colon and cecum.                            The polyps were 3 to 12 mm in size. These polyps                            were removed with a cold snare. Resection and                            retrieval were complete.                           Normal mucosa was found in the entire colon                            otherwise.                           Non-bleeding non-thrombosed external and internal                            hemorrhoids were found during retroflexion, during                            perianal exam and during digital exam. The                            hemorrhoids were Grade III (internal hemorrhoids                            that prolapse but require manual reduction). Complications:            No immediate complications. Estimated Blood Loss:     Estimated blood loss was minimal. Impression:               - Hemorrhoids found on digital rectal exam.                           -  The examined portion of the ileum was normal.                           - Four 3 to 12 mm polyps in the descending colon,                            in the ascending colon and in the cecum, removed                            with a cold snare. Resected and retrieved.                           - Normal mucosa in the entire examined colon                            otherwise.                           -  Non-bleeding non-thrombosed external and internal                            hemorrhoids. Recommendation:           - The patient will be observed post-procedure,                            until all discharge criteria are met.                           - Discharge patient to home.                           - Patient has a contact number available for                            emergencies. The signs and symptoms of potential                            delayed complications were discussed with the                            patient. Return to normal activities tomorrow.                            Written discharge instructions were provided to the                            patient.                           - High fiber diet.                           - Use FiberCon 1-2 tablets PO daily.                           - Continue present medications.                           - Await pathology results.                           - Repeat colonoscopy in 3 years for surveillance                            based on pathology results.                           - May trial Dicyclomine 10 mg 1-3 times daily as                            needed in setting of significant abdominal cramping                            should this make any  benefit for patient in future                            of his discomfort.                           - The findings and recommendations were discussed                            with the patient. Justice Britain, MD 03/21/2020 9:58:20 AM

## 2020-03-21 NOTE — Patient Instructions (Addendum)
Thank you for allowing [place] to care for you today!  Await biopsy results, approximately 7-10 days.  Resume previous diet and medications today.  New prescription, Omeprazole 40 mg by mouth daily. And Bentyl 1-3 times daily as needed for abdominal cramping . [facility].  Add more fiber to your diet.   Also recommend FiberCon 1-2 tablets daily  Return to your normal activities tomorrow.  You may notice slight bleeding today if you pass any prep or have bowel movement.  Repeat upper endoscopy in 4 months to check healing in your stomach and esophagus.   YOU HAD AN ENDOSCOPIC PROCEDURE TODAY AT [facility]:   Refer to the procedure report that was given to you for any specific questions about what was found during the examination.  If the procedure report does not answer your questions, please call your gastroenterologist to clarify.  If you requested that your care partner not be given the details of your procedure findings, then the procedure report has been included in a sealed envelope for you to review at your convenience later.  YOU SHOULD EXPECT: Some feelings of bloating in the abdomen. Passage of more gas than usual.  Walking can help get rid of the air that was put into your GI tract during the procedure and reduce the bloating. If you had a lower endoscopy (such as a colonoscopy or flexible sigmoidoscopy) you may notice spotting of blood in your stool or on the toilet paper. If you underwent a bowel prep for your procedure, you may not have a normal bowel movement for a few days.  Please Note:  You might notice some irritation and congestion in your nose or some drainage.  This is from the oxygen used during your procedure.  There is no need for concern and it should clear up in a day or so.  SYMPTOMS TO REPORT IMMEDIATELY:   Following lower endoscopy (colonoscopy or flexible sigmoidoscopy):  Excessive amounts of blood in the  stool  Significant tenderness or worsening of abdominal pains  Swelling of the abdomen that is new, acute  Fever of 100F or higher   Following upper endoscopy (EGD)  Vomiting of blood or coffee ground material  New chest pain or pain under the shoulder blades  Painful or persistently difficult swallowing  New shortness of breath  Fever of 100F or higher  Black, tarry-looking stools  For urgent or emergent issues, a gastroenterologist can be reached at any hour by calling [phone]. Do not use MyChart messaging for urgent concerns.    DIET:  We do recommend a small meal at first, but then you may proceed to your regular diet.  Drink plenty of fluids but you should avoid alcoholic beverages for 24 hours.  ACTIVITY:  You should plan to take it easy for the rest of today and you should NOT DRIVE or use heavy machinery until tomorrow (because of the sedation medicines used during the test).    FOLLOW UP: Our staff will call the number listed on your records 48-72 hours following your procedure to check on you and address any questions or concerns that you may have regarding the information given to you following your procedure. If we do not reach you, we will leave a message.  We will attempt to reach you two times.  During this call, we will ask if you have developed any symptoms of COVID 19. If you develop any symptoms (ie: fever, flu-like symptoms,  shortness of breath, cough etc.) before then, please call [phone].  If you test positive for Covid 19 in the 2 weeks post procedure, please call and report this information to [place].    If any biopsies were taken you will be contacted by phone or by letter within the next 1-3 weeks.  Please call us at [phone] if you have not heard about the biopsies in 3 weeks.    SIGNATURES/CONFIDENTIALITY: You and/or your care partner have signed paperwork which will be entered into your electronic medical record.  These signatures attest to  the fact that that the information above on your After Visit Summary has been reviewed and is understood.  Full responsibility of the confidentiality of this discharge information lies with you and/or your care-partner.

## 2020-03-23 ENCOUNTER — Telehealth: Payer: Self-pay

## 2020-03-23 NOTE — Telephone Encounter (Signed)
  Follow up Call-  Call back number 03/21/2020  Post procedure Call Back phone  # [phone]  Permission to leave phone message Yes  Some recent data might be hidden     Patient questions:  Do you have a fever, pain , or abdominal swelling? No. Pain Score  0 *  Have you tolerated food without any problems? Yes.    Have you been able to return to your normal activities? Yes.    Do you have any questions about your discharge instructions: Diet   No. Medications  No. Follow up visit  No.  Do you have questions or concerns about your Care? No.  Actions: * If pain score is 4 or above: No action needed, pain <4.  1. Have you developed a fever since your procedure? no  2.   Have you had an respiratory symptoms (SOB or cough) since your procedure? no  3.   Have you tested positive for COVID 19 since your procedure no  4.   Have you had any family members/close contacts diagnosed with the COVID 19 since your procedure?  no   If yes to any of these questions please route to Joylene John, RN and Joella Prince, RN

## 2020-03-23 NOTE — Telephone Encounter (Signed)
Attempted to reach pt. With follow-up call following endoscopic procedure 03/21/2020.  LM on pt. Voice mail.  Will try to reach pt. Again later today.

## 2020-03-26 ENCOUNTER — Encounter: Payer: Self-pay | Admitting: Gastroenterology

## 2020-05-12 DIAGNOSIS — Z20822 Contact with and (suspected) exposure to covid-19: Secondary | ICD-10-CM | POA: Diagnosis not present

## 2020-06-14 DIAGNOSIS — L820 Inflamed seborrheic keratosis: Secondary | ICD-10-CM | POA: Diagnosis not present

## 2020-06-14 DIAGNOSIS — L570 Actinic keratosis: Secondary | ICD-10-CM | POA: Diagnosis not present

## 2020-06-14 DIAGNOSIS — D2262 Melanocytic nevi of left upper limb, including shoulder: Secondary | ICD-10-CM | POA: Diagnosis not present

## 2020-06-14 DIAGNOSIS — D225 Melanocytic nevi of trunk: Secondary | ICD-10-CM | POA: Diagnosis not present

## 2020-06-14 DIAGNOSIS — L814 Other melanin hyperpigmentation: Secondary | ICD-10-CM | POA: Diagnosis not present

## 2020-12-05 ENCOUNTER — Encounter: Payer: Self-pay | Admitting: Gastroenterology

## 2021-04-05 IMAGING — CR DG FINGER THUMB 2+V*R*
3 series · 3 of 3 positions shown · non-contrast
Comparison: None.

CLINICAL DATA: Chronic right thumb pain.

EXAM:
RIGHT THUMB 2+V

[x finger pa right]
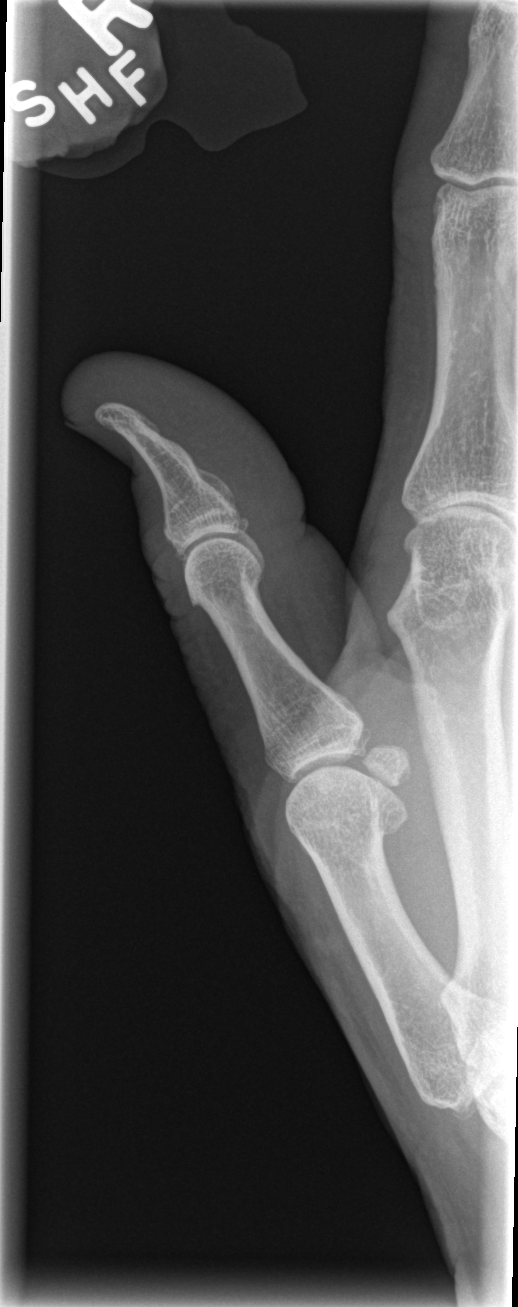

[x finger obl. right]
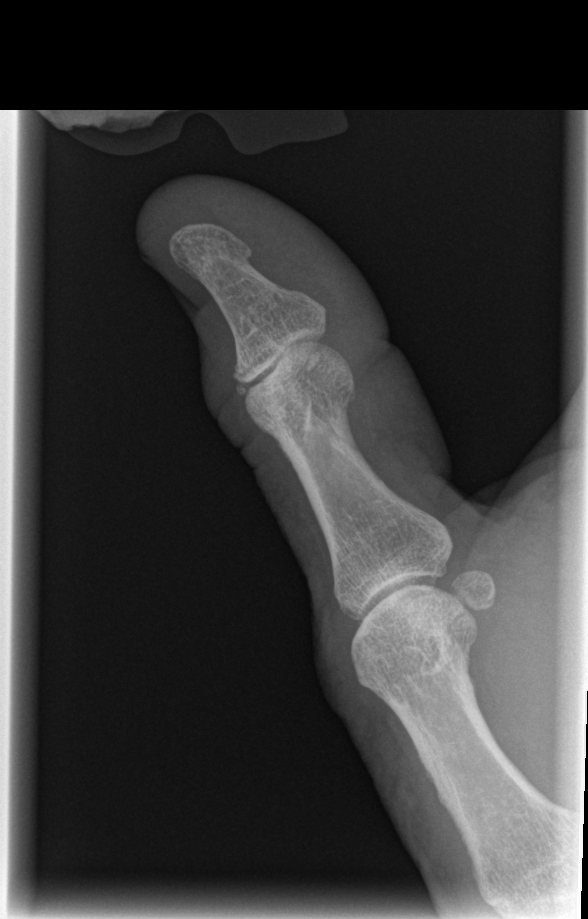

[x finger lateral right]
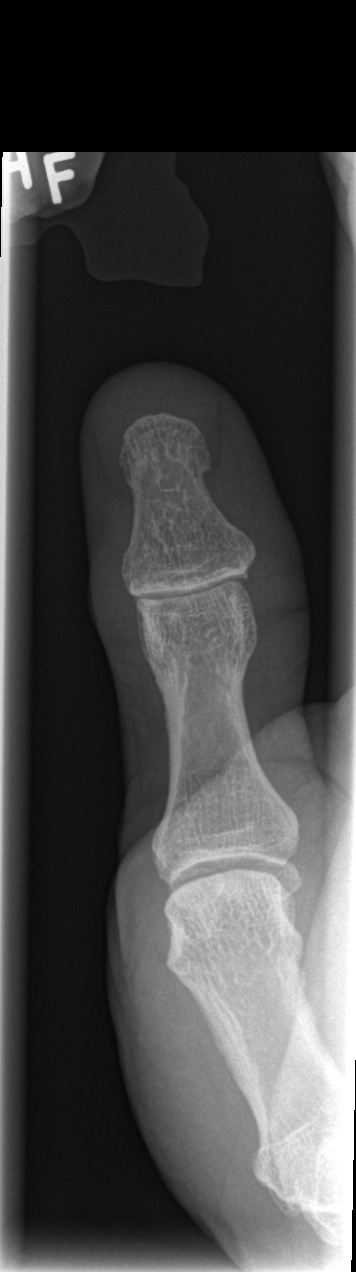

[3 of 3 positions shown; findings below may reference images not displayed]

FINDINGS: No acute fracture or dislocation. Minimal degenerative changes of
the first IP joint. Mild osteoarthritis of the second MCP joint.
Bone mineralization is normal. Soft tissues are unremarkable.
IMPRESSION: Minimal degenerative changes of the first IP joint.

## 2021-05-17 ENCOUNTER — Encounter (HOSPITAL_COMMUNITY): Payer: Self-pay

## 2021-05-17 ENCOUNTER — Other Ambulatory Visit: Payer: Self-pay

## 2021-05-17 ENCOUNTER — Ambulatory Visit: Payer: BC Managed Care – PPO | Admitting: Family

## 2021-05-17 ENCOUNTER — Telehealth: Payer: Self-pay

## 2021-05-17 ENCOUNTER — Emergency Department (HOSPITAL_COMMUNITY)
Admission: EM | Admit: 2021-05-17 | Discharge: 2021-05-17 | Disposition: A | Discharge status: Home / Self Care | Payer: BC Managed Care – PPO | Attending: Student | Admitting: Student

## 2021-05-17 ENCOUNTER — Telehealth: Payer: Self-pay | Admitting: Internal Medicine

## 2021-05-17 DIAGNOSIS — Z5321 Procedure and treatment not carried out due to patient leaving prior to being seen by health care provider: Secondary | ICD-10-CM | POA: Insufficient documentation

## 2021-05-17 DIAGNOSIS — R0789 Other chest pain: Secondary | ICD-10-CM | POA: Insufficient documentation

## 2021-05-17 DIAGNOSIS — R059 Cough, unspecified: Secondary | ICD-10-CM | POA: Insufficient documentation

## 2021-05-17 DIAGNOSIS — R0981 Nasal congestion: Secondary | ICD-10-CM | POA: Diagnosis not present

## 2021-05-17 DIAGNOSIS — R000 Tachycardia, unspecified: Secondary | ICD-10-CM | POA: Insufficient documentation

## 2021-05-17 NOTE — ED Triage Notes (Signed)
Patient presents from home patient reports feeling heart racing and pressure on chest. Also endorses dizziness.  Patient reports taking nyquil around 1245am  Hx of A Flutter with ablation, has had recurrence of A flutter.

## 2021-05-17 NOTE — Telephone Encounter (Signed)
Pt c/o of Chest Pain: STAT if CP now or developed within 24 hours  1. Are you having CP right now? no  2. Are you experiencing any other symptoms (ex. SOB, nausea, vomiting, sweating)? Had SOB last night, lightheaded  3. How long have you been experiencing CP? Right before 1 am last night  4. Is your CP continuous or coming and going? Came and went after 45 min - hour  5. Have you taken Nitroglycerin? no   Patient states last night at around 1 am he started having chest pressure and SOB. He says he went to the ED and they wanted to keep him over night and do lab work, but he did not stay. He says in the lobby his HR was 52, but when he got into a room it went up to 92. He says his home BP cuff says his HR was 90, but he thinks the batteries are bad in it.   ?

## 2021-05-17 NOTE — ED Notes (Signed)
Staff told me that pt was refusing labs and wanted to leave.  RN went to speak to both pt and visitor.  Pt stated that since the EKG was "normal" he was not going to worry about blood work.  RN explained that just b/c the EKG was "normal" as he stated that it did not mean that the heart was in distress.  RN explained about NSTEMI however pt stated he had too much to do tomorrow and had to go.  Pt did sign the MSE.

## 2021-05-17 NOTE — Telephone Encounter (Signed)
Patient has what he calls an episode last night. Occurred at 1 am and lasted about about 45 mins he felt lightheaded, chest pressure, palpations, and SOB. He noted he exerted himself yesterday evening, skating for ~90 mins. He does this 6-7 times a year. Stating he continued to sweat most of the way home. (Traveling from [place]) He currently has a head cold and is taking Tylenol. Symptoms include congestion and occasional coughing. No lifestyle changes. No missed doses of medication. He went to the ER and they did two EKG's. Stating the heart rate was in the 90's however when rate checked in waiting area it was ~ 50 per patient. He left before anything else was done in the ER. Today his is feeling back to normal with no symptoms. Not able to check VS at this time. Patient wants to know if he needs labs like they wanted to get in the ER or a visit?   Of note last OV Jan of 21 per MD Lovena Le Assess/Plan: 1. Palpitations - these are fairly well controlled. We discussed wearing a Zio monitor for 14 days but at this point he would like to wait and see if his symptoms worsen.  Gave ED precautions.  Patient verbalized understand.

## 2021-05-17 NOTE — ED Provider Triage Note (Signed)
Emergency Medicine Provider Triage Evaluation Note  [name] , a 61 y.o. male  was evaluated in triage.  Pt complains of heart racing and chest pressure.  Shortly after taking a dose of NyQuil around 12:45 AM patient reports he started feeling heart racing and fluttering sensation.  Home blood pressure machine showed a heart rate of 190, with this he developed left-sided chest pressure.  Patient reports heart racing has resolved and chest pressure has eased up but has not completely resolved.  History of a flutter, has had ablation previously and has required cardioversion.  Followed by Dr. Lovena Le with cardiology  Review of Systems  Positive: Chest pain, palpitations, cough, congestion Negative: Fever, vomiting,  Physical Exam  BP (!) 147/92 (BP Location: Left Arm)    Pulse (!) 50    Temp 98.2 F (36.8 C) (Oral)    Resp (!) 22    SpO2 99%  Gen:   Awake, no distress   Resp:  Normal effort  Cardiac: Heart RRR  MSK:   Moves extremities without difficulty  Other:    Medical Decision Making  Medically screening exam initiated at 2:21 AM.  Appropriate orders placed.  [name] was informed that the remainder of the evaluation will be completed by another provider, this initial triage assessment does not replace that evaluation, and the importance of remaining in the ED until their evaluation is complete.  EKG now shows normal sinus rhythm, work-up initiated for chest pain and palpitations.   [name], [place] 05/17/21 0231

## 2021-05-17 NOTE — Telephone Encounter (Signed)
[place], [name], CMA Recent cardiac work up in ED. Pt did not stay for full work up. Call and ask pt to follow up. Sometime since last seen and may need to refer back to cardiologist. If any chest pain/symptoms during interim then ED evaluation again.     Called phone number in chart , received a busy dial tone , called wife ( on [place]) left a voicemail to return to get patient scheduled for a visit today

## 2021-05-17 NOTE — Telephone Encounter (Signed)
Pt called back , I offered him an appointment at [facility] since they have openings he denied the appointment stated he is doing fine and it was nothing serious last night, and then also made him aware he has not been seen since 2021 and needs to an appointment with his PCP and he stated " he is doing fine , doesn't think he need an appointment".Marland Kitchen

## 2021-07-06 DIAGNOSIS — L918 Other hypertrophic disorders of the skin: Secondary | ICD-10-CM | POA: Diagnosis not present

## 2021-07-06 DIAGNOSIS — L821 Other seborrheic keratosis: Secondary | ICD-10-CM | POA: Diagnosis not present

## 2021-07-06 DIAGNOSIS — L570 Actinic keratosis: Secondary | ICD-10-CM | POA: Diagnosis not present

## 2021-07-06 DIAGNOSIS — D225 Melanocytic nevi of trunk: Secondary | ICD-10-CM | POA: Diagnosis not present

## 2021-08-16 DIAGNOSIS — I4891 Unspecified atrial fibrillation: Secondary | ICD-10-CM | POA: Diagnosis not present

## 2021-08-16 DIAGNOSIS — R778 Other specified abnormalities of plasma proteins: Secondary | ICD-10-CM | POA: Diagnosis not present

## 2021-08-16 DIAGNOSIS — E860 Dehydration: Secondary | ICD-10-CM | POA: Diagnosis not present

## 2021-08-16 DIAGNOSIS — R079 Chest pain, unspecified: Secondary | ICD-10-CM | POA: Diagnosis not present

## 2021-08-16 DIAGNOSIS — I482 Chronic atrial fibrillation, unspecified: Secondary | ICD-10-CM | POA: Diagnosis not present

## 2021-08-16 DIAGNOSIS — Z7901 Long term (current) use of anticoagulants: Secondary | ICD-10-CM | POA: Diagnosis not present

## 2021-08-16 DIAGNOSIS — Z5321 Procedure and treatment not carried out due to patient leaving prior to being seen by health care provider: Secondary | ICD-10-CM | POA: Diagnosis not present

## 2021-08-16 DIAGNOSIS — R002 Palpitations: Secondary | ICD-10-CM | POA: Diagnosis not present

## 2021-08-16 DIAGNOSIS — Z743 Need for continuous supervision: Secondary | ICD-10-CM | POA: Diagnosis not present

## 2021-09-21 ENCOUNTER — Ambulatory Visit: Payer: BC Managed Care – PPO | Admitting: Internal Medicine

## 2021-09-28 ENCOUNTER — Encounter: Payer: Self-pay | Admitting: Internal Medicine

## 2021-09-28 ENCOUNTER — Ambulatory Visit: Payer: BC Managed Care – PPO | Admitting: Internal Medicine

## 2021-09-28 ENCOUNTER — Telehealth: Payer: Self-pay | Admitting: *Deleted

## 2021-09-28 VITALS — BP 124/68 | HR 70 | Ht 74.0 in | Wt 255.0 lb

## 2021-09-28 DIAGNOSIS — R002 Palpitations: Secondary | ICD-10-CM | POA: Diagnosis not present

## 2021-09-28 DIAGNOSIS — R0683 Snoring: Secondary | ICD-10-CM | POA: Diagnosis not present

## 2021-09-28 NOTE — Telephone Encounter (Signed)
Pt was seen in the office today with DR. Lovena Le who ordered an Biomedical scientist. Drue Novel, RN , Clinic Supervisor set up pt with sleep study and registered the pt into [employer]. Pt agreeable to signed waiver.

## 2021-09-28 NOTE — Patient Instructions (Addendum)
Medication Instructions:  Your physician recommends that you continue on your current medications as directed. Please refer to the Current Medication list given to you today.  Labwork: None ordered.  Testing/Procedures: Your physician has recommended that you have a sleep study. This test records several body functions during sleep, including: brain activity, eye movement, oxygen and carbon dioxide blood levels, heart rate and rhythm, breathing rate and rhythm, the flow of air through your mouth and nose, snoring, body muscle movements, and chest and belly movement.   Follow-Up: Your physician wants you to follow-up based on sleep study results.   Any Other Special Instructions Will Be Listed Below (If Applicable).  If you need a refill on your cardiac medications before your next appointment, please call your pharmacy.   Important Information About Sugar       .stopbang

## 2021-09-28 NOTE — Progress Notes (Signed)
HPI Mr. Crean returns today for followup. He is a pleasant 61 yo man with a h/o SVT and atrial flutter with both ablated in 2017. He has had occaisional palpitations. He had 4 beers and then some Nyquill and developed recurrent palpitations and went to [facility] in January but we do not have any ECG's of his arrhythmia. He had another episode when he was in [place] a few weeks ago. No ECG's from that visit but the episode as well. He also continues to have cramps when he sweats. No Known Allergies   Current Outpatient Medications  Medication Sig Dispense Refill   acetaminophen (TYLENOL) 500 MG tablet Take 500 mg by mouth every 6 (six) hours as needed.      aspirin EC 81 MG tablet Take 1 tablet (81 mg total) by mouth daily. 90 tablet 3   dicyclomine (BENTYL) 10 MG capsule Take 1 capsule (10 mg total) by mouth 3 (three) times daily as needed for spasms. 90 capsule 0   omeprazole (PRILOSEC) 40 MG capsule Take 1 capsule (40 mg total) by mouth daily. 30 capsule 4   No current facility-administered medications for this visit.     Past Medical History:  Diagnosis Date   GERD (gastroesophageal reflux disease)    Obesity    Paroxysmal atrial flutter (HCC)    PSVT (paroxysmal supraventricular tachycardia) (HCC)     ROS:   All systems reviewed and negative except as noted in the HPI.   Past Surgical History:  Procedure Laterality Date   COLONOSCOPY  03/21/2020   ELECTROPHYSIOLOGIC STUDY N/A 05/02/2015   Procedure: A-Flutter Ablation;  Surgeon: Evans Lance, MD;  Location: [facility];  Service: Cardiovascular;  Laterality: N/A;   right toe surgery  2012   UPPER GASTROINTESTINAL ENDOSCOPY  03/21/2020     Family History  Problem Relation Age of Onset   Aneurysm Mother 59       brain   Congestive Heart Failure Father    Diabetes Father    Other Cousin        abdominal pain, "dead colon"   Colon cancer Neg Hx    Colon polyps Neg Hx    Esophageal cancer Neg Hx     Rectal cancer Neg Hx    Stomach cancer Neg Hx      Social History   Socioeconomic History   Marital status: Married    Spouse name: Not on file   Number of children: 1   Years of education: Not on file   Highest education level: Not on file  Occupational History   Occupation: lawn care    Employer: [employer]  Tobacco Use   Smoking status: Never   Smokeless tobacco: Never  Vaping Use   Vaping Use: Never used  Substance and Sexual Activity   Alcohol use: Yes    Alcohol/week: 7.0 standard drinks    Types: 2 Cans of beer, 1 Shots of liquor, 4 Glasses of wine per week    Comment: occasionally   Drug use: No   Sexual activity: Not on file  Other Topics Concern   Not on file  Social History Narrative   Not on file   Social Determinants of Health   Financial Resource Strain: Not on file  Food Insecurity: Not on file  Transportation Needs: Not on file  Physical Activity: Not on file  Stress: Not on file  Social Connections: Not on file  Intimate Partner Violence: Not on  file     BP 124/68   Pulse 70   Ht '6\' 2"'$  (1.88 m)   Wt 255 lb (115.7 kg)   SpO2 95%   BMI 32.74 kg/m   Physical Exam:  Well appearing 61 yo man, NAD HEENT: Unremarkable Neck:  No JVD, no thyromegally Lymphatics:  No adenopathy Back:  No CVA tenderness Lungs:  Clear with no wheezes HEART:  Regular rate rhythm, no murmurs, no rubs, no clicks Abd:  soft, positive bowel sounds, no organomegally, no rebound, no guarding Ext:  2 plus pulses, no edema, no cyanosis, no clubbing Skin:  No rashes no nodules Neuro:  CN II through XII intact, motor grossly intact  EKG - nsr   Assess/Plan:  Palpitations - he is having occaisional episodes. Because they are far apart, I have recommended watchful waiting. If they increase in frequency then a 14 day zio monitor would be recommended. Cramps - I encouraged either gatorate or diluted juice and hydration. Carleene Overlie Lindia Garms,MD

## 2021-10-05 NOTE — Telephone Encounter (Signed)
Set up date 09/28/21

## 2021-10-15 NOTE — Telephone Encounter (Addendum)
S/w the pt and informed him that just received a note from our authorization dept that the [name] sleep study was denied. See notes. I assured the that I will inform Dr. Lovena Le study denied. I did inform the pt that the device will need to be returned to the office. Please return to [facility]/Home Sleep Studies. Once the device has been returned I will un-register the device. Pt is agreeable to return the device.

## 2021-10-15 NOTE — Telephone Encounter (Signed)
Prior Authorization for [facility] sent to [facility] via web portal. Tracking Number [id number]. NOT APPROVED. NOT MEDICALLY NECESSARY. Can call the preauthorization department at [phone] ext [phone] for provider courtesy review within 180 days. Follow the prompts.

## 2021-10-16 NOTE — Telephone Encounter (Signed)
Received device back intact and put back in stock.

## 2021-10-17 NOTE — Telephone Encounter (Signed)
Device has been returned and unregistered.

## 2021-10-22 DIAGNOSIS — M79645 Pain in left finger(s): Secondary | ICD-10-CM | POA: Diagnosis not present

## 2021-10-22 DIAGNOSIS — S63642A Sprain of metacarpophalangeal joint of left thumb, initial encounter: Secondary | ICD-10-CM | POA: Diagnosis not present

## 2021-10-24 ENCOUNTER — Other Ambulatory Visit: Payer: Self-pay | Admitting: Orthopedic Surgery

## 2021-10-24 DIAGNOSIS — S63642A Sprain of metacarpophalangeal joint of left thumb, initial encounter: Secondary | ICD-10-CM

## 2021-10-31 DIAGNOSIS — M25532 Pain in left wrist: Secondary | ICD-10-CM | POA: Diagnosis not present

## 2021-10-31 DIAGNOSIS — M79645 Pain in left finger(s): Secondary | ICD-10-CM | POA: Diagnosis not present

## 2021-11-01 ENCOUNTER — Other Ambulatory Visit: Payer: BC Managed Care – PPO

## 2021-11-01 NOTE — Telephone Encounter (Signed)
Appeal started for denial of sleep study.  Please follow.

## 2021-11-07 DIAGNOSIS — S63642A Sprain of metacarpophalangeal joint of left thumb, initial encounter: Secondary | ICD-10-CM | POA: Diagnosis not present

## 2021-11-15 NOTE — Telephone Encounter (Signed)
Received notification from [place] that Pt's home sleep study has been approved.  Preauthorization number:  [id number]  Please call Pt and set up with [facility] sleep study.

## 2021-11-15 NOTE — Telephone Encounter (Signed)
Dr. Tanna Furry nurse Sonia Baller came to me this morning stating she was able to get the Itamar sleep study approved. Sonia Baller asked me what is the next step. I stated that the pt will need to come by the office for a new device. Sonia Baller, stated she also sent a message to Milas Hock. CMA and Erasmo Score. CMA that the Jaynie Crumble was approved.   I called the pt and informed him that Sonia Baller was able to get the Imperial approved. I asked the pt when would be a good time for him to stop by the office and pick up a new device, as he has been approved. Pt tells me he will do at some other time. Pt states it will be a winter project. I asked the pt then if he wants to call us when he is ready to set the Itamar sleep study. Pt said yes. I will update Dr. Lovena Le and his nurse the pt does not want to the sleep at this time and will probably wait until winter time per the pt.   I am not sure if we will have to have another prior auth checked if pt waits until winter time.

## 2021-11-28 DIAGNOSIS — S63642A Sprain of metacarpophalangeal joint of left thumb, initial encounter: Secondary | ICD-10-CM | POA: Diagnosis not present

## 2021-12-19 DIAGNOSIS — S63642A Sprain of metacarpophalangeal joint of left thumb, initial encounter: Secondary | ICD-10-CM | POA: Diagnosis not present

## 2021-12-27 DIAGNOSIS — M25632 Stiffness of left wrist, not elsewhere classified: Secondary | ICD-10-CM | POA: Diagnosis not present

## 2021-12-27 DIAGNOSIS — M79645 Pain in left finger(s): Secondary | ICD-10-CM | POA: Diagnosis not present

## 2021-12-27 DIAGNOSIS — M25642 Stiffness of left hand, not elsewhere classified: Secondary | ICD-10-CM | POA: Diagnosis not present

## 2021-12-27 DIAGNOSIS — S63642A Sprain of metacarpophalangeal joint of left thumb, initial encounter: Secondary | ICD-10-CM | POA: Diagnosis not present

## 2022-01-16 DIAGNOSIS — S63642A Sprain of metacarpophalangeal joint of left thumb, initial encounter: Secondary | ICD-10-CM | POA: Diagnosis not present

## 2022-05-01 ENCOUNTER — Encounter: Payer: Self-pay | Admitting: Medical

## 2022-05-01 ENCOUNTER — Ambulatory Visit (INDEPENDENT_AMBULATORY_CARE_PROVIDER_SITE_OTHER): Payer: BC Managed Care – PPO | Admitting: Medical

## 2022-05-01 VITALS — BP 130/70 | HR 95 | Temp 97.7°F | Resp 18 | Ht 74.0 in | Wt 256.2 lb

## 2022-05-01 DIAGNOSIS — Z0000 Encounter for general adult medical examination without abnormal findings: Secondary | ICD-10-CM

## 2022-05-01 DIAGNOSIS — H814 Vertigo of central origin: Secondary | ICD-10-CM

## 2022-05-01 DIAGNOSIS — R42 Dizziness and giddiness: Secondary | ICD-10-CM | POA: Diagnosis not present

## 2022-05-01 DIAGNOSIS — Z125 Encounter for screening for malignant neoplasm of prostate: Secondary | ICD-10-CM | POA: Diagnosis not present

## 2022-05-01 DIAGNOSIS — G4489 Other headache syndrome: Secondary | ICD-10-CM | POA: Diagnosis not present

## 2022-05-01 DIAGNOSIS — M542 Cervicalgia: Secondary | ICD-10-CM

## 2022-05-01 DIAGNOSIS — M5410 Radiculopathy, site unspecified: Secondary | ICD-10-CM

## 2022-05-01 LAB — LIPID PANEL
Cholesterol: 231 mg/dL — ABNORMAL HIGH (ref 0–200)
HDL: 59.5 mg/dL (ref >39.00)
LDL Cholesterol: 141 mg/dL — ABNORMAL HIGH (ref 0–99)
NonHDL: 171.54
Total CHOL/HDL Ratio: 4
Triglycerides: 151 mg/dL — ABNORMAL HIGH (ref 0.0–149.0)
VLDL: 30.2 mg/dL (ref 0.0–40.0)

## 2022-05-01 LAB — CBC WITH DIFFERENTIAL/PLATELET
Basophils Absolute: 0 10*3/uL (ref 0.0–0.1)
Basophils Relative: 0.5 % (ref 0.0–3.0)
Eosinophils Absolute: 0.1 10*3/uL (ref 0.0–0.7)
Eosinophils Relative: 1.5 % (ref 0.0–5.0)
HCT: 46 % (ref 39.0–52.0)
Hemoglobin: 15.7 g/dL (ref 13.0–17.0)
Lymphocytes Relative: 39.1 % (ref 12.0–46.0)
Lymphs Abs: 2.3 10*3/uL (ref 0.7–4.0)
MCHC: 34 g/dL (ref 30.0–36.0)
MCV: 88.2 fl (ref 78.0–100.0)
Monocytes Absolute: 0.5 10*3/uL (ref 0.1–1.0)
Monocytes Relative: 9 % (ref 3.0–12.0)
Neutro Abs: 3 10*3/uL (ref 1.4–7.7)
Neutrophils Relative %: 49.9 % (ref 43.0–77.0)
Platelets: 235 10*3/uL (ref 150.0–400.0)
RBC: 5.22 Mil/uL (ref 4.22–5.81)
RDW: 13.1 % (ref 11.5–15.5)
WBC: 6 10*3/uL (ref 4.0–10.5)

## 2022-05-01 LAB — COMPREHENSIVE METABOLIC PANEL
ALT: 19 U/L (ref 0–53)
AST: 12 U/L (ref 0–37)
Albumin: 4.4 g/dL (ref 3.5–5.2)
Alkaline Phosphatase: 69 U/L (ref 39–117)
BUN: 17 mg/dL (ref 6–23)
CO2: 29 mEq/L (ref 19–32)
Calcium: 9.4 mg/dL (ref 8.4–10.5)
Chloride: 104 mEq/L (ref 96–112)
Creatinine, Ser: 0.76 mg/dL (ref 0.40–1.50)
GFR: 97.56 mL/min (ref >60.00)
Glucose, Bld: 95 mg/dL (ref 70–99)
Potassium: 4.1 mEq/L (ref 3.5–5.1)
Sodium: 142 mEq/L (ref 135–145)
Total Bilirubin: 0.5 mg/dL (ref 0.2–1.2)
Total Protein: 6.9 g/dL (ref 6.0–8.3)

## 2022-05-01 LAB — PSA: PSA: 0.87 ng/mL (ref 0.10–4.00)

## 2022-05-01 NOTE — Patient Instructions (Addendum)
For you wellness exam today cbc, cmp and lipid panel  Vaccine declined today. Can consider tdap, flu and shingrix for later if you decide.  Recommend exercise and healthy diet.  We will let you know lab results as they come in.  Follow up date appointment will be determined after lab review.    For neck pain with crepitus got c spine xray.  For intermittent ha, dizziness and visual issues focusing at times placed ct of head order today. This will need to be prior authorized.  If acute severe symptoms then be seen in the ED.   Preventive Care 60-104 Years Old, Male Preventive care refers to lifestyle choices and visits with your health care provider that can promote health and wellness. Preventive care visits are also called wellness exams. What can I expect for my preventive care visit? Counseling During your preventive care visit, your health care provider may ask about your: Medical history, including: Past medical problems. Family medical history. Current health, including: Emotional well-being. Home life and relationship well-being. Sexual activity. Lifestyle, including: Alcohol, nicotine or tobacco, and drug use. Access to firearms. Diet, exercise, and sleep habits. Safety issues such as seatbelt and bike helmet use. Sunscreen use. Work and work Statistician. Physical exam Your health care provider will check your: Height and weight. These may be used to calculate your BMI (body mass index). BMI is a measurement that tells if you are at a healthy weight. Waist circumference. This measures the distance around your waistline. This measurement also tells if you are at a healthy weight and may help predict your risk of certain diseases, such as type 2 diabetes and high blood pressure. Heart rate and blood pressure. Body temperature. Skin for abnormal spots. What immunizations do I need?  Vaccines are usually given at various ages, according to a schedule. Your health care  provider will recommend vaccines for you based on your age, medical history, and lifestyle or other factors, such as travel or where you work. What tests do I need? Screening Your health care provider may recommend screening tests for certain conditions. This may include: Lipid and cholesterol levels. Diabetes screening. This is done by checking your blood sugar (glucose) after you have not eaten for a while (fasting). Hepatitis B test. Hepatitis C test. HIV (human immunodeficiency virus) test. STI (sexually transmitted infection) testing, if you are at risk. Lung cancer screening. Prostate cancer screening. Colorectal cancer screening. Talk with your health care provider about your test results, treatment options, and if necessary, the need for more tests. Follow these instructions at home: Eating and drinking  Eat a diet that includes fresh fruits and vegetables, whole grains, lean protein, and low-fat dairy products. Take vitamin and mineral supplements as recommended by your health care provider. Do not drink alcohol if your health care provider tells you not to drink. If you drink alcohol: Limit how much you have to 0-2 drinks a day. Know how much alcohol is in your drink. In the [place] one drink equals one 12 oz bottle of beer (355 mL), one 5 oz glass of wine (148 mL), or one 1 oz glass of hard liquor (44 mL). Lifestyle Brush your teeth every morning and night with fluoride toothpaste. Floss one time each day. Exercise for at least 30 minutes 5 or more days each week. Do not use any products that contain nicotine or tobacco. These products include cigarettes, chewing tobacco, and vaping devices, such as e-cigarettes. If you need help quitting, ask your health  care provider. Do not use drugs. If you are sexually active, practice safe sex. Use a condom or other form of protection to prevent STIs. Take aspirin only as told by your health care provider. Make sure that you understand  how much to take and what form to take. Work with your health care provider to find out whether it is safe and beneficial for you to take aspirin daily. Find healthy ways to manage stress, such as: Meditation, yoga, or listening to music. Journaling. Talking to a trusted person. Spending time with friends and family. Minimize exposure to UV radiation to reduce your risk of skin cancer. Safety Always wear your seat belt while driving or riding in a vehicle. Do not drive: If you have been drinking alcohol. Do not ride with someone who has been drinking. When you are tired or distracted. While texting. If you have been using any mind-altering substances or drugs. Wear a helmet and other protective equipment during sports activities. If you have firearms in your house, make sure you follow all gun safety procedures. What's next? Go to your health care provider once a year for an annual wellness visit. Ask your health care provider how often you should have your eyes and teeth checked. Stay up to date on all vaccines. This information is not intended to replace advice given to you by your health care provider. Make sure you discuss any questions you have with your health care provider. Document Revised: 10/18/2020 Document Reviewed: 10/18/2020 Elsevier Patient Education  [place].

## 2022-05-01 NOTE — Addendum Note (Signed)
Addended by: Manuela Schwartz on: 05/01/2022 11:12 AM   Modules accepted: Orders

## 2022-05-01 NOTE — Progress Notes (Addendum)
Subjective:    Patient ID: [name], male    DOB: 09/10/1960, 61 y.o.   MRN: [id number]  HPI  Pt has acute complaint but on review noted cpe/wellness exam. So decided to go ahead and do wellness as well as address his acute complaint.  Former pt of Dr. Milly Jakob on review. Pt not seen since 2021. Declines vaccines. Up to date on colonoscopy.  Pt in for evaluation. Pt states he light headed/swimmy head sensation and some difficulty focusing his visual file. Some occasional intermittent headache that is mild. Mom passed from ruptured aortic aneurysm. Pt states combination of signs and symptoms since June 2022. Last 4 month head swimmy sensation is more prominent.  He mentioned one time on vacation pain in rt side back of head was more prominent.    When pt checks his bp during events and bp will be 130/80.   Hx of psvt in the past. Past time had event was in April.    Review of Systems  Constitutional:  Negative for chills, fatigue and fever.  HENT:  Negative for congestion, ear discharge and facial swelling.   Eyes:        Trouble focusing with visual field.  Respiratory:  Negative for chest tightness, shortness of breath and wheezing.   Cardiovascular:  Negative for chest pain and palpitations.  Gastrointestinal:  Negative for abdominal pain.  Genitourinary:  Negative for dysuria and frequency.  Musculoskeletal:  Positive for neck pain.       Crepitus.  Skin:  Negative for rash.  Neurological:  Positive for dizziness. Negative for facial asymmetry, light-headedness and numbness.  Hematological:  Negative for adenopathy. Does not bruise/bleed easily.  Psychiatric/Behavioral:  Negative for behavioral problems and decreased concentration.    Past Medical History:  Diagnosis Date   GERD (gastroesophageal reflux disease)    Obesity    Paroxysmal atrial flutter (HCC)    PSVT (paroxysmal supraventricular tachycardia)      Social History   Socioeconomic History   Marital  status: Married    Spouse name: Not on file   Number of children: 1   Years of education: Not on file   Highest education level: Not on file  Occupational History   Occupation: lawn care    Employer: [employer]  Tobacco Use   Smoking status: Never   Smokeless tobacco: Never  Vaping Use   Vaping Use: Never used  Substance and Sexual Activity   Alcohol use: Yes    Alcohol/week: 7.0 standard drinks of alcohol    Types: 2 Cans of beer, 1 Shots of liquor, 4 Glasses of wine per week    Comment: occasionally   Drug use: No   Sexual activity: Not on file  Other Topics Concern   Not on file  Social History Narrative   Not on file   Social Determinants of Health   Financial Resource Strain: Not on file  Food Insecurity: Not on file  Transportation Needs: Not on file  Physical Activity: Not on file  Stress: Not on file  Social Connections: Not on file  Intimate Partner Violence: Not on file    Past Surgical History:  Procedure Laterality Date   COLONOSCOPY  03/21/2020   ELECTROPHYSIOLOGIC STUDY N/A 05/02/2015   Procedure: A-Flutter Ablation;  Surgeon: Evans Lance, MD;  Location: [facility];  Service: Cardiovascular;  Laterality: N/A;   right toe surgery  2012   UPPER GASTROINTESTINAL ENDOSCOPY  03/21/2020    Family  History  Problem Relation Age of Onset   Aneurysm Mother 73       brain   Congestive Heart Failure Father    Diabetes Father    Other Cousin        abdominal pain, "dead colon"   Colon cancer Neg Hx    Colon polyps Neg Hx    Esophageal cancer Neg Hx    Rectal cancer Neg Hx    Stomach cancer Neg Hx     No Known Allergies  Current Outpatient Medications on File Prior to Visit  Medication Sig Dispense Refill   acetaminophen (TYLENOL) 500 MG tablet Take 500 mg by mouth every 6 (six) hours as needed.      aspirin EC 81 MG tablet Take 1 tablet (81 mg total) by mouth daily. 90 tablet 3   dicyclomine (BENTYL) 10 MG capsule Take 1 capsule  (10 mg total) by mouth 3 (three) times daily as needed for spasms. 90 capsule 0   omeprazole (PRILOSEC) 40 MG capsule Take 1 capsule (40 mg total) by mouth daily. 30 capsule 4   No current facility-administered medications on file prior to visit.    BP 130/70   Pulse 95   Temp 97.7 F (36.5 C)   Resp 18   Ht '6\' 2"'$  (1.88 m)   Wt 256 lb 3.2 oz (116.2 kg)   SpO2 98%   BMI 32.89 kg/m        Objective:   Physical Exam   General Mental Status- Alert. General Appearance- Not in acute distress.   Skin General: Color- Normal Color. Moisture- Normal Moisture.  Neck Carotid Arteries- Normal color. Moisture- Normal Moisture. No carotid bruits. No JVD. Rt side trapezius faint tenderness.   Chest and Lung Exam Auscultation: Breath Sounds:-Normal.  Cardiovascular Auscultation:Rythm- Regular. Murmurs & Other Heart Sounds:Auscultation of the heart reveals- No Murmurs.  Abdomen Inspection:-Inspeection Normal. Palpation/Percussion:Note:No mass. Palpation and Percussion of the abdomen reveal- Non Tender, Non Distended + BS, no rebound or guarding.  Neurologic Cranial Nerve exam:- CN III-XII intact(No nystagmus), symmetric smile. Strength:- 5/5 equal and symmetric strength both upper and lower extremities.      Assessment & Plan:   Patient Instructions  For you wellness exam today cbc, cmp and lipid panel  Vaccine declined today. Can consider tdap, flu and shingrix for later if your decide.  Recommend exercise and healthy diet.  We will let you know lab results as they come in.  Follow up date appointment will be determined after lab review.    For neck pain with crepitus got c spine xray.  For intermittent ha, dizziness and visual issues focusing at times placed ct of head order today. This will need to be prior authorized.  If acute severe symptoms then be seen in the ED.      Mackie Pai, [place]    [id number] charge in addition to wellness exam. Addressed neck pain,  dizziness and intermittent ha.

## 2022-05-02 ENCOUNTER — Ambulatory Visit (INDEPENDENT_AMBULATORY_CARE_PROVIDER_SITE_OTHER): Payer: BC Managed Care – PPO

## 2022-05-02 DIAGNOSIS — M542 Cervicalgia: Secondary | ICD-10-CM | POA: Diagnosis not present

## 2022-05-02 DIAGNOSIS — H814 Vertigo of central origin: Secondary | ICD-10-CM | POA: Diagnosis not present

## 2022-05-02 DIAGNOSIS — R519 Headache, unspecified: Secondary | ICD-10-CM

## 2022-05-02 MED ORDER — AZITHROMYCIN 250 MG PO TABS: ORAL_TABLET | ORAL | 0 refills | Status: AC

## 2022-05-02 NOTE — Addendum Note (Signed)
Addended by: Anabel Halon on: 05/02/2022 08:15 PM   Modules accepted: Orders

## 2022-05-03 ENCOUNTER — Telehealth: Payer: Self-pay

## 2022-05-03 NOTE — Telephone Encounter (Signed)
Please place referral for neurosurgery

## 2022-05-07 NOTE — Addendum Note (Signed)
Addended by: Anabel Halon on: 05/07/2022 10:40 AM   Modules accepted: Orders

## 2022-05-07 NOTE — Telephone Encounter (Signed)
Pt stated yes when I spoke with him Friday about his labs and wanted the referral

## 2022-05-09 ENCOUNTER — Telehealth: Payer: Self-pay | Admitting: Medical

## 2022-05-09 NOTE — Telephone Encounter (Signed)
Pt called stating he is still having some issues with his cough. He stated it comes and goes but would like to see if there is anything else that could be sent in or OTC that [name] would recommend. Please Advise.

## 2022-05-10 MED ORDER — BENZONATATE 100 MG PO CAPS: 100.0000 mg | ORAL_CAPSULE | Freq: Three times a day (TID) | ORAL | 0 refills | Status: DC | PRN

## 2022-05-10 NOTE — Telephone Encounter (Signed)
Cough day 3 -- colored mucus , productive cough .. Unable to sleep with cough and he using muccinexx dm and it isnt helping , no congestion , no fever .Marland Kitchen Leaving at noon to go to [place] wants script or suggestion before leaving

## 2022-05-10 NOTE — Addendum Note (Signed)
Addended by: Anabel Halon on: 05/10/2022 10:28 AM   Modules accepted: Orders

## 2022-05-10 NOTE — Telephone Encounter (Signed)
Pt.notified

## 2023-03-05 NOTE — Progress Notes (Signed)
Cardiology Office Note:  .   Date:  03/07/2023  ID:  Ninetta Lights, DOB 07-Dec-1960, MRN [id number] PCP: Marisue Brooklyn  [facility] Providers Cardiologist:  Lewayne Bunting, MD    History of Present Illness: .   YITZCHAK KOTHARI is a 62 y.o. male with a past medical history of PSVT, hypertension, atrial flutter, ED, snoring.  11/20/2017 stress echo negative stress echo, no evidence of ischemia 05/02/2015 atrial flutter & SVT ablation 12/23/2014 echo technically limited study, LVH noted, EF 50%  Most evaluated by Dr. Ladona Ridgel on 09/28/2021, he reported at this time occasional palpitations that surrounded alcohol consumption with NyQuil.  Also bothered by snoring, and at home sleep study was arranged unclear if this was completed.  He presents today with concerns of worsening palpitations.  He states over the last few weeks he feels they have been increasing with frequency.  He also has episodes of chest tightness, he is not sure if they correlate with his palpitations or not.  The episodes of chest tightness typically occur when he is at rest. He denies dyspnea, pnd, orthopnea, n, v, dizziness, syncope, edema, weight gain, or early satiety.   ROS: Review of Systems  Cardiovascular:  Positive for chest pain and palpitations.  All other systems reviewed and are negative.    Studies Reviewed: Marland Kitchen   EKG Interpretation Date/Time:  Thursday March 06 2023 14:35:15 EDT Ventricular Rate:  85 PR Interval:  144 QRS Duration:  90 QT Interval:  370 QTC Calculation: 440 R Axis:   54  Text Interpretation: Normal sinus rhythm Normal ECG When compared with ECG of 17-May-2021 02:23, No significant change was found Confirmed by Wallis Bamberg [phone]) on 03/07/2023 10:08:59 AM    Cardiac Studies & Procedures     STRESS TESTS  ECHOCARDIOGRAM STRESS TEST 11/20/2017  Narrative *[facility]* [address] [place], [place]  [address] [phone]  ------------------------------------------------------------------- Stress Echocardiography  Patient:    Taras, Rask MR #:       [id number] Study Date: 11/20/2017 Gender:     M Age:        57 Height:     188 cm Weight:     116.8 kg BSA:        2.5 m^2 Pt. Status: Room:  SONOGRAPHER  Cathie Beams ATTENDING    Dunn, Dayna N ORDERING     Dunn, Dayna N REFERRING    Dunn, Dayna N PERFORMING   [facility], Outpatient  cc:  -------------------------------------------------------------------  ------------------------------------------------------------------- Indications:      R07.9 Chest pain.  ------------------------------------------------------------------- History:   PMH:  Paroxysmal atrial flutter. Paroxysmal supraventricular tachycardia.  Medications:  No other medications.  ------------------------------------------------------------------- Impressions:  - Negative stress echo. no evidence of ischemia.  ------------------------------------------------------------------- Study data:   Study status:  Routine.  Consent:  The risks, benefits, and alternatives to the procedure were explained to the patient and informed consent was obtained.  Procedure:  The patient reported no pain pre or post test. Initial setup. The patient was brought to the laboratory. A baseline ECG was recorded. Surface ECG leads and automatic cuff blood pressure measurements were monitored. Treadmill exercise testing was performed using the Bruce protocol. The patient exercised for 10 min, to protocol stage 4, to a maximal work rate of 11.7 mets. Exercise was terminated due to achievement of target heart rate and fatigue. The patient was positioned for image acquisition and recovery monitoring. Transthoracic stress echocardiography for chest pain evaluation and Definity was administered.Marland Kitchen  Image quality was adequate. Images were captured at baseline and peak exercise. There  were no ST or T wave changes to suggest ischemia Study completion:  The patient tolerated the procedure well. There were no complications.          Bruce protocol. Stress echocardiography.  Birthdate:  Patient birthdate: May 27, 1960.  Age: Patient is 62 yr old.  Sex:  Gender: male.    BMI: 33.1 kg/m^2. Blood pressure:     139/98  Patient status:  Outpatient.  Study date:  Study date: 11/20/2017. Study time: 07:48 AM.  -------------------------------------------------------------------  ------------------------------------------------------------------- Stress protocol:  +---------------------+---+------------+---------+ !Stage                !HR !BP (mmHg)   !Symptoms ! +---------------------+---+------------+---------+ !Baseline             !80 !139/98 (112)!None     ! +---------------------+---+------------+---------+ !Stage 1              !106!161/87 (112)!None     ! +---------------------+---+------------+---------+ !Stage 2              !125!168/86 (113)!None     ! +---------------------+---+------------+---------+ !Stage 3              !151!197/82 (120)!Fatigue  ! +---------------------+---+------------+---------+ !Stage 4              !160!------------!Fatigue  ! +---------------------+---+------------+---------+ !Immediate post stress!162!182/78 (113)!Subsiding! +---------------------+---+------------+---------+ !Recovery; 1 min      !123!------------!None     ! +---------------------+---+------------+---------+ !Recovery; 2 min      !107!------------!None     ! +---------------------+---+------------+---------+ !Recovery; 3 min      !105!165/77 (106)!None     ! +---------------------+---+------------+---------+ !Recovery; 4 min      !99 !------------!None     ! +---------------------+---+------------+---------+ !Recovery; 5 min      !99 !119/74 (89) !None     ! +---------------------+---+------------+---------+ !Late recovery        !96 !------------!None      ! +---------------------+---+------------+---------+  ------------------------------------------------------------------- Stress results:   Maximal heart rate during stress was 162 bpm (99% of maximal predicted heart rate). The maximal predicted heart rate was 164 bpm.The target heart rate was achieved. The heart rate response to stress was normal. There was a normal resting blood pressure with an appropriate response to stress. The rate-pressure product for the peak heart rate and blood pressure was 32355 mm Hg/min.  ------------------------------------------------------------------- Baseline:  - LV size was normal. - LV global systolic function was normal. - Normal wall motion; no LV regional wall motion abnormalities.  Peak stress:  - LV global systolic function was vigorous. - Normal wall motion; no LV regional wall motion abnormalities. - No evidence for new LV regional wall motion abnormalities.  ------------------------------------------------------------------- Prepared and Electronically Authenticated by  Kristeen Miss, M.D. 2019-07-18T11:26:57   ECHOCARDIOGRAM  ECHOCARDIOGRAM COMPLETE 12/23/2014  Narrative *[place]* *[facility]* [address] [place], [place] [address] [phone]  ------------------------------------------------------------------- Transthoracic Echocardiography  Patient:    Tysheem, Accardo MR #:       [id number] Study Date: 12/23/2014 Gender:     M Age:        54 Height:     188 cm Weight:     115.7 kg BSA:        2.49 m^2 Pt. Status: Room:       2H18C  ADMITTING    Eduard Clos SONOGRAPHER  Nolon Rod, RDCS ATTENDING    Esperanza Sheets PERFORMING   [facility], Inpatient ORDERING  Glory Buff, Amber K REFERRING    [place], [facility] K  cc:  ------------------------------------------------------------------- LV EF: 50%  ------------------------------------------------------------------- Indications:       Atrial flutter 427.32.  ------------------------------------------------------------------- History:   Risk factors:  Obese.  ------------------------------------------------------------------- Study Conclusions  - Left ventricle: Technically limited study. Inferior hypokinesis. The cavity size was mildly dilated. Wall thickness was increased in a pattern of mild LVH. The estimated ejection fraction was 50%. - Aorta: Aortic root is upper normal. - Right ventricle: The cavity size was mildly dilated. Systolic function was mildly reduced.  Transthoracic echocardiography.  M-mode, complete 2D, spectral Doppler, and color Doppler.  Birthdate:  Patient birthdate: Jan 08, 1961.  Age:  Patient is 62 yr old.  Sex:  Gender: male. BMI: 32.7 kg/m^2.  Blood pressure:     142/81  Patient status: Inpatient.  Study date:  Study date: 12/23/2014. Study time: 01:31 PM.  Location:  ICU/CCU  -------------------------------------------------------------------  ------------------------------------------------------------------- Left ventricle:  Technically limited study. Inferior hypokinesis. The cavity size was mildly dilated. Wall thickness was increased in a pattern of mild LVH. The estimated ejection fraction was 50%.  ------------------------------------------------------------------- Aortic valve:   Structurally normal valve.   Cusp separation was normal.  Doppler:  Transvalvular velocity was within the normal range. There was no stenosis. There was no regurgitation.  ------------------------------------------------------------------- Aorta:  Aortic root is upper normal.  ------------------------------------------------------------------- Mitral valve:   Structurally normal valve.   Leaflet separation was normal.  Doppler:  Transvalvular velocity was within the normal range. There was no evidence for stenosis. There was  no regurgitation.  ------------------------------------------------------------------- Left atrium:  The atrium was at the upper limits of normal in size.  ------------------------------------------------------------------- Right ventricle:  The cavity size was mildly dilated. Systolic function was mildly reduced.  ------------------------------------------------------------------- Pulmonic valve:   Poorly visualized.  ------------------------------------------------------------------- Tricuspid valve:  Poorly visualized.  ------------------------------------------------------------------- Right atrium:  Poorly visualized.  ------------------------------------------------------------------- Pericardium:  There was no pericardial effusion.  ------------------------------------------------------------------- Post procedure conclusions Ascending Aorta:  - Aortic root is upper normal.  ------------------------------------------------------------------- Measurements  Left ventricle                             Value        Reference LV ID, ED, PLAX chordal          (H)       56.7  mm     43 - 52 LV ID, ES, PLAX chordal          (H)       38.6  mm     23 - 38 LV fx shortening, PLAX chordal             32    %      >=29 LV PW thickness, ED                        11.7  mm     --------- IVS/LV PW ratio, ED                        1.09         <=1.3 LV ejection fraction, 1-p A4C              52    %      --------- LV end-diastolic volume, 2-p               89  ml     --------- LV end-systolic volume, 2-p                40    ml     --------- LV ejection fraction, 2-p                  55    %      --------- Stroke volume, 2-p                         49    ml     --------- LV end-diastolic volume/bsa, 2-p           36    ml/m^2 --------- LV end-systolic volume/bsa, 2-p            16    ml/m^2 --------- Stroke volume/bsa, 2-p                     19.6  ml/m^2 --------- LV e&', lateral                              11.2  cm/s   --------- LV E/e&', lateral                           5.89         --------- LV e&', medial                              9.36  cm/s   --------- LV E/e&', medial                            7.05         --------- LV e&', average                             10.28 cm/s   --------- LV E/e&', average                           6.42         ---------  Ventricular septum                         Value        Reference IVS thickness, ED                          12.8  mm     ---------  LVOT                                       Value        Reference LVOT ID, S                                 26    mm     --------- LVOT area  5.31  cm^2   ---------  Aorta                                      Value        Reference Aortic root ID, ED                         38    mm     ---------  Left atrium                                Value        Reference LA ID, A-P, ES                             32    mm     --------- LA ID/bsa, A-P                             1.29  cm/m^2 <=2.2 LA volume, S                               74.9  ml     --------- LA volume/bsa, S                           30.1  ml/m^2 --------- LA volume, ES, 1-p A4C                     51.1  ml     --------- LA volume/bsa, ES, 1-p A4C                 20.5  ml/m^2 --------- LA volume, ES, 1-p A2C                     103   ml     --------- LA volume/bsa, ES, 1-p A2C                 41.4  ml/m^2 ---------  Mitral valve                               Value        Reference Mitral E-wave peak velocity                66    cm/s   --------- Mitral A-wave peak velocity                38.3  cm/s   --------- Mitral deceleration time                   201   ms     150 - 230 Mitral E/A ratio, peak                     1.7          ---------  Right ventricle                            Value  Reference RV s&', lateral, S                          12.4  cm/s    ---------  Legend: (L)  and  (H)  mark values outside specified reference range.  ------------------------------------------------------------------- Prepared and Electronically Authenticated by  Willa Rough, MD 2016-08-19T16:45:38             Risk Assessment/Calculations:    CHA2DS2-VASc Score = 1   This indicates a 0.6% annual risk of stroke. The patient's score is based upon: CHF History: 0 HTN History: 1 Diabetes History: 0 Stroke History: 0 Vascular Disease History: 0 Age Score: 0 Gender Score: 0     HYPERTENSION CONTROL Vitals:   03/06/23 1422 03/07/23 1011  BP: (!) 132/92 (!) 132/92    The patient's blood pressure is elevated above target today.  In order to address the patient's elevated BP: Blood pressure will be monitored at home to determine if medication changes need to be made.       VS:  BP (!) 132/92   Pulse 85   Ht 6\' 2"  (1.88 m)   Wt 241 lb (109.3 kg)   SpO2 97%   BMI 30.94 kg/m    Wt Readings from Last 3 Encounters:  03/06/23 241 lb (109.3 kg)  05/01/22 256 lb 3.2 oz (116.2 kg)  09/28/21 255 lb (115.7 kg)    GEN: Well nourished, well developed in no acute distress NECK: No JVD; No carotid bruits CARDIAC: RRR, no murmurs, rubs, gallops RESPIRATORY:  Clear to auscultation without rales, wheezing or rhonchi  ABDOMEN: Soft, non-tender, non-distended EXTREMITIES:  No edema; No deformity   ASSESSMENT AND PLAN: .   Atrial flutter/SVT palpitations-s/p ablation in 2016, he continued have episodes of palpitations since then however he feels like they have increased with frequency over the last few weeks and this has been understandably bothersome for him.  Will check CBC, BMET, TSH, magnesium for any contributory causes of his palpitations.  Will repeat a monitor to assess for the causes of his palpitations and overall burden.  Precordial pain-described as intermittent tightness, it does not sound to be consistent with angina however there are  comorbid conditions.  Will arrange for coronary CTA for further evaluation.  Dyslipidemia-LDL is elevated at 141 on 82/95/6213 , not on any lipid-lowering agents, will address at next OV.    Dispo: Coronary CTA, 2-week monitor, labs per above.  Follow-up with me in 6 weeks.  Signed, Flossie Dibble, NP

## 2023-03-06 ENCOUNTER — Ambulatory Visit: Payer: No Typology Code available for payment source | Attending: Cardiology | Admitting: Cardiology

## 2023-03-06 ENCOUNTER — Encounter: Payer: Self-pay | Admitting: Cardiology

## 2023-03-06 ENCOUNTER — Ambulatory Visit: Payer: No Typology Code available for payment source | Attending: Cardiology

## 2023-03-06 VITALS — BP 132/92 | HR 85 | Ht 74.0 in | Wt 241.0 lb

## 2023-03-06 DIAGNOSIS — I471 Supraventricular tachycardia, unspecified: Secondary | ICD-10-CM

## 2023-03-06 DIAGNOSIS — R072 Precordial pain: Secondary | ICD-10-CM

## 2023-03-06 DIAGNOSIS — E782 Mixed hyperlipidemia: Secondary | ICD-10-CM

## 2023-03-06 DIAGNOSIS — R002 Palpitations: Secondary | ICD-10-CM | POA: Diagnosis not present

## 2023-03-06 DIAGNOSIS — I4892 Unspecified atrial flutter: Secondary | ICD-10-CM

## 2023-03-06 MED ORDER — METOPROLOL TARTRATE 100 MG PO TABS: 100.0000 mg | ORAL_TABLET | Freq: Once | ORAL | 0 refills | Status: DC

## 2023-03-06 MED ORDER — METOPROLOL TARTRATE 100 MG PO TABS: 100.0000 mg | ORAL_TABLET | Freq: Two times a day (BID) | ORAL | 3 refills | Status: DC

## 2023-03-06 NOTE — Patient Instructions (Addendum)
Medication Instructions:  Your physician recommends that you continue on your current medications as directed. Please refer to the Current Medication list given to you today.   *If you need a refill on your cardiac medications before your next appointment, please call your pharmacy*   Lab Work: Your physician recommends that you return for lab work in: Today for CBC, BMP, TSH and Magnesium  If you have labs (blood work) drawn today and your tests are completely normal, you will receive your results only by: MyChart Message (if you have MyChart) OR A paper copy in the mail If you have any lab test that is abnormal or we need to change your treatment, we will call you to review the results.   Testing/Procedures: You have been asked to wear a Zio Heart Monitor today. It is to be worn for 14 days. Please remove the monitor on 11/14  and mail back in the box provided.  If you have any questions about the monitor please call the company at [phone]   Please arrive at the [facility] main entrance of [facility] at xx:xx AM (30-45 minutes prior to test start time)  [facility] [address] [place], [place] [address] [phone]  Proceed to the [facility] Radiology Department (First Floor).  Please follow these instructions carefully (unless otherwise directed):  Hold all erectile dysfunction medications at least 48 hours prior to test.  On the Night Before the Test: Drink plenty of water. Do not consume any caffeinated/decaffeinated beverages or chocolate 12 hours prior to your test. Do not take any antihistamines 12 hours prior to your test1On the Day of the Test: Drink plenty of water. Do not drink any water within one hour of the test. Do not eat any food 4 hours prior to the test. You may take your regular medications prior to the test. IF NOT ON A BETA BLOCKER - Take 100 mg of lopressor (metoprolol) one hour before the test.  After the  Test: Drink plenty of water. After receiving IV contrast, you may experience a mild flushed feeling. This is normal. On occasion, you may experience a mild rash up to 24 hours after the test. This is not dangerous. If this occurs, you can take Benadryl 25 mg and increase your fluid intake. If you experience trouble breathing, this can be serious. If it is severe call 911 IMMEDIATELY. If it is mild, please call our office. If you take any of these medications: Glipizide/Metformin, Avandament, Glucavance, please do not take 48 hours after completing test.     Follow-Up: At [facility], you and your health needs are our priority.  As part of our continuing mission to provide you with exceptional heart care, we have created designated Provider Care Teams.  These Care Teams include your primary Cardiologist (physician) and Advanced Practice Providers (APPs -  Physician Assistants and Nurse Practitioners) who all work together to provide you with the care you need, when you need it.  We recommend signing up for the patient portal called "MyChart".  Sign up information is provided on this After Visit Summary.  MyChart is used to connect with patients for Virtual Visits (Telemedicine).  Patients are able to view lab/test results, encounter notes, upcoming appointments, etc.  Non-urgent messages can be sent to your provider as well.   To learn more about what you can do with MyChart, go to [email or web address].    Your next appointment:   6 week(s)  Provider:  Wallis Bamberg, NP Rosalita Levan)    Other Instructions

## 2023-03-07 LAB — CBC WITH DIFFERENTIAL/PLATELET
Basophils Absolute: 0 x10E3/uL (ref 0.0–0.2)
Basos: 0 %
EOS (ABSOLUTE): 0.1 x10E3/uL (ref 0.0–0.4)
Eos: 2 %
Hematocrit: 46.7 % (ref 37.5–51.0)
Hemoglobin: 15 g/dL (ref 13.0–17.7)
Immature Grans (Abs): 0 x10E3/uL (ref 0.0–0.1)
Immature Granulocytes: 0 %
Lymphocytes Absolute: 2.5 x10E3/uL (ref 0.7–3.1)
Lymphs: 40 %
MCH: 29.1 pg (ref 26.6–33.0)
MCHC: 32.1 g/dL (ref 31.5–35.7)
MCV: 91 fL (ref 79–97)
Monocytes Absolute: 0.5 x10E3/uL (ref 0.1–0.9)
Monocytes: 8 %
Neutrophils Absolute: 3.1 x10E3/uL (ref 1.4–7.0)
Neutrophils: 50 %
Platelets: 235 x10E3/uL (ref 150–450)
RBC: 5.16 x10E6/uL (ref 4.14–5.80)
RDW: 12.4 % (ref 11.6–15.4)
WBC: 6.2 x10E3/uL (ref 3.4–10.8)

## 2023-03-07 LAB — BASIC METABOLIC PANEL WITH GFR
BUN/Creatinine Ratio: 26 — ABNORMAL HIGH (ref 10–24)
BUN: 18 mg/dL (ref 8–27)
CO2: 22 mmol/L (ref 20–29)
Calcium: 9.4 mg/dL (ref 8.6–10.2)
Chloride: 104 mmol/L (ref 96–106)
Creatinine, Ser: 0.7 mg/dL — ABNORMAL LOW (ref 0.76–1.27)
Glucose: 85 mg/dL (ref 70–99)
Potassium: 4.1 mmol/L (ref 3.5–5.2)
Sodium: 141 mmol/L (ref 134–144)
eGFR: 105 mL/min/1.73

## 2023-03-07 LAB — TSH: TSH: 1.67 u[IU]/mL (ref 0.450–4.500)

## 2023-03-07 LAB — MAGNESIUM: Magnesium: 2 mg/dL (ref 1.6–2.3)

## 2023-03-10 ENCOUNTER — Telehealth: Payer: Self-pay | Admitting: *Deleted

## 2023-03-10 ENCOUNTER — Telehealth: Payer: Self-pay | Admitting: Cardiology

## 2023-03-10 NOTE — Telephone Encounter (Signed)
-----   Message from Flossie Dibble sent at 03/07/2023  9:43 AM EDT ----- Electrolytes are normal.  Kidney function is normal. CBC without anemia or infection. Thyroid is normal. Magnesium is normal.  These are all good test results, nothing to attribute to episodes of palpitations.

## 2023-03-10 NOTE — Telephone Encounter (Signed)
Informed pt of normal labs. Pt verbalized understanding and had no further questions

## 2023-03-10 NOTE — Telephone Encounter (Signed)
Unable to leave message due to mailbox being full.

## 2023-03-10 NOTE — Telephone Encounter (Signed)
Full vm 

## 2023-03-10 NOTE — Telephone Encounter (Signed)
Patient is returning phone call about results. 

## 2023-03-11 NOTE — Telephone Encounter (Signed)
Patient informed of results.  

## 2023-03-21 ENCOUNTER — Telehealth (HOSPITAL_COMMUNITY): Payer: Self-pay | Admitting: *Deleted

## 2023-03-21 NOTE — Telephone Encounter (Signed)
Reaching out to patient to offer assistance regarding upcoming cardiac imaging study; pt verbalizes understanding of appt date/time, parking situation and where to check in, pre-test NPO status and medications ordered, and verified current allergies; name and call back number provided for further questions should they arise Hayley Sharpe RN Navigator Cardiac Imaging [facility] [phone] office [phone] cell  

## 2023-03-24 ENCOUNTER — Ambulatory Visit (HOSPITAL_COMMUNITY)
Admission: RE | Admit: 2023-03-24 | Discharge: 2023-03-24 | Disposition: A | Discharge status: Home / Self Care | Payer: No Typology Code available for payment source | Source: Ambulatory Visit | Attending: Cardiology | Admitting: Cardiology

## 2023-03-24 ENCOUNTER — Encounter: Payer: Self-pay | Admitting: Gastroenterology

## 2023-03-24 DIAGNOSIS — R072 Precordial pain: Secondary | ICD-10-CM | POA: Diagnosis present

## 2023-03-24 MED ORDER — IOHEXOL 350 MG/ML SOLN
95.0000 mL | Freq: Once | INTRAVENOUS | Status: AC | PRN
  Administered 2023-03-24: 95 mL via INTRAVENOUS

## 2023-03-24 MED ORDER — NITROGLYCERIN 0.4 MG SL SUBL
0.8000 mg | SUBLINGUAL_TABLET | SUBLINGUAL | Status: DC | PRN
  Administered 2023-03-24: 0.8 mg via SUBLINGUAL

## 2023-03-24 MED ORDER — NITROGLYCERIN 0.4 MG SL SUBL
SUBLINGUAL_TABLET | SUBLINGUAL | Status: AC
  Filled 2023-03-24: qty 1

## 2023-03-25 ENCOUNTER — Telehealth: Payer: Self-pay | Admitting: *Deleted

## 2023-03-25 NOTE — Telephone Encounter (Signed)
-----   Message from Flossie Dibble sent at 03/25/2023  7:55 AM EST ----- Two parts of the cCTA -- our part, reveals that you do have coronary artery disease but it is not obstructing blood flow which means it is not the source of your pain. Once the radiologist has read their part, we will let you know if anything is abnormal from their perspective.   Continue ASA daily.   We need to get better control of your cholesterol which will prevent the disease that is there from worsening.   Come back for FLP and LFTs.

## 2023-03-25 NOTE — Telephone Encounter (Signed)
Let pt know about CT results and pt needs to come in for labs. Pt stated he has appt with Victorino Dike on 12/12 and will get blood work done then. Pt verbalized understanding and had no further questions.

## 2023-04-14 ENCOUNTER — Telehealth: Payer: Self-pay | Admitting: Emergency Medicine

## 2023-04-14 NOTE — Telephone Encounter (Signed)
Results reviewed with pt as per Wallis Bamberg PA's note.  Pt verbalized understanding and had no additional questions. Routed to PCP.

## 2023-04-14 NOTE — Telephone Encounter (Signed)
-----   Message from Flossie Dibble sent at 04/14/2023  8:00 AM EST ----- Over-read by radiologist was unremarkable, no extra cardiac abnormal findings.

## 2023-04-16 NOTE — Progress Notes (Signed)
 " Cardiology Office Note:  .   Date:  04/17/2023  ID:  [name] Brochure, DOB 12/31/1960, MRN [id number] PCP: Dorina Dallas RIGGERS  [facility] Providers Cardiologist:  Danelle Birmingham, MD    History of Present Illness: .   [name] is a 62 y.o. male with a past medical history of nonobstructive CAD per coronary CTA, PSVT, hypertension, atrial flutter, ED, snoring.  03/06/2023 monitor predominant underlying rhythm was sinus, average heart rate 82 bpm, 7 episodes of SVT 03/24/2023 coronary CTA calcium score 39.5, 51st percentile, minimal mixed density plaque in the LAD/left circumflex/RCA, mild dilatation of the aortic root at 41 mm 11/20/2017 stress echo negative stress echo, no evidence of ischemia 05/02/2015 atrial flutter & SVT ablation 12/23/2014 echo technically limited study, LVH noted, EF 50%  Most evaluated by Dr. Birmingham on 09/28/2021, he reported at this time occasional palpitations that surrounded alcohol consumption with NyQuil.  Also bothered by snoring, and at home sleep study was arranged unclear if this was completed.  Evaluated 03/06/2023 for follow-up of palpitations, he felt he had been worsening recently with increase in frequency.  He also had episodes of chest tightness that typically occurred with rest.  He has comorbid conditions so we decided to evaluate further with a coronary CTA which revealed a calcium score 39.5, nonobstructive CAD.  Repeat monitor revealed predominantly in sinus rhythm, episodes of SVT.  He presents today for follow-up of his recent testing including abnormal coronary CTA and monitor.  He is actually feeling better since he was last evaluated in the office, feels like maybe has episodes of palpitations and chest tightness were associated with high level of stress.  He has been doing good, offers no formal complaints.  We discussed the evidence of coronary artery disease that was noted on CT imaging, as well as risk factors that can be  modified. He denies chest pain, palpitations, dyspnea, pnd, orthopnea, n, v, dizziness, syncope, edema, weight gain, or early satiety.    ROS: Review of Systems  All other systems reviewed and are negative.    Studies Reviewed: .        Cardiac Studies & Procedures     STRESS TESTS  ECHOCARDIOGRAM STRESS TEST 11/20/2017  Narrative *[facility]* [address] [place], [place] [address] [phone]  ------------------------------------------------------------------- Stress Echocardiography  Patient:    [name], [name] MR #:       [id number] Study Date: 11/20/2017 Gender:     M Age:        57 Height:     188 cm Weight:     116.8 kg BSA:        2.5 m^2 Pt. Status: Room:  SONOGRAPHER  Jon Hacker ATTENDING    Dunn, Dayna N ORDERING     Dunn, Dayna N REFERRING    Dunn, Dayna N PERFORMING   [facility], Outpatient  cc:  -------------------------------------------------------------------  ------------------------------------------------------------------- Indications:      R07.9 Chest pain.  ------------------------------------------------------------------- History:   PMH:  Paroxysmal atrial flutter. Paroxysmal supraventricular tachycardia.  Medications:  No other medications.  ------------------------------------------------------------------- Impressions:  - Negative stress echo. no evidence of ischemia.  ------------------------------------------------------------------- Study data:   Study status:  Routine.  Consent:  The risks, benefits, and alternatives to the procedure were explained to the patient and informed consent was obtained.  Procedure:  The patient reported no pain pre or post test. Initial setup. The patient was brought to the laboratory. A baseline ECG was recorded. Surface ECG  leads and automatic cuff blood pressure measurements were monitored. Treadmill exercise testing was performed using the Bruce protocol. The patient exercised  for 10 min, to protocol stage 4, to a maximal work rate of 11.7 mets. Exercise was terminated due to achievement of target heart rate and fatigue. The patient was positioned for image acquisition and recovery monitoring. Transthoracic stress echocardiography for chest pain evaluation and Definity  was administered.. Image quality was adequate. Images were captured at baseline and peak exercise. There were no ST or T wave changes to suggest ischemia Study completion:  The patient tolerated the procedure well. There were no complications.          Bruce protocol. Stress echocardiography.  Birthdate:  Patient birthdate: 05-08-1960.  Age: Patient is 62 yr old.  Sex:  Gender: male.    BMI: 33.1 kg/m^2. Blood pressure:     139/98  Patient status:  Outpatient.  Study date:  Study date: 11/20/2017. Study time: 07:48 AM.  -------------------------------------------------------------------  ------------------------------------------------------------------- Stress protocol:  +---------------------+---+------------+---------+ !Stage                !HR !BP (mmHg)   !Symptoms ! +---------------------+---+------------+---------+ !Baseline             !80 !139/98 (112)!None     ! +---------------------+---+------------+---------+ !Stage 1              !106!161/87 (112)!None     ! +---------------------+---+------------+---------+ !Stage 2              !125!168/86 (113)!None     ! +---------------------+---+------------+---------+ !Stage 3              !151!197/82 (120)!Fatigue  ! +---------------------+---+------------+---------+ !Stage 4              !160!------------!Fatigue  ! +---------------------+---+------------+---------+ !Immediate post stress!162!182/78 (113)!Subsiding! +---------------------+---+------------+---------+ !Recovery; 1 min      !123!------------!None     ! +---------------------+---+------------+---------+ !Recovery; 2 min      !107!------------!None      ! +---------------------+---+------------+---------+ !Recovery; 3 min      !105!165/77 (106)!None     ! +---------------------+---+------------+---------+ !Recovery; 4 min      !99 !------------!None     ! +---------------------+---+------------+---------+ !Recovery; 5 min      !99 !119/74 (89) !None     ! +---------------------+---+------------+---------+ !Late recovery        !96 !------------!None     ! +---------------------+---+------------+---------+  ------------------------------------------------------------------- Stress results:   Maximal heart rate during stress was 162 bpm (99% of maximal predicted heart rate). The maximal predicted heart rate was 164 bpm.The target heart rate was achieved. The heart rate response to stress was normal. There was a normal resting blood pressure with an appropriate response to stress. The rate-pressure product for the peak heart rate and blood pressure was 70252 mm Hg/min.  ------------------------------------------------------------------- Baseline:  - LV size was normal. - LV global systolic function was normal. - Normal wall motion; no LV regional wall motion abnormalities.  Peak stress:  - LV global systolic function was vigorous. - Normal wall motion; no LV regional wall motion abnormalities. - No evidence for new LV regional wall motion abnormalities.  ------------------------------------------------------------------- Prepared and Electronically Authenticated by  Aleene Passe, M.D. 2019-07-18T11:26:57  ECHOCARDIOGRAM  ECHOCARDIOGRAM COMPLETE 12/23/2014  Narrative *[place]* *[facility]* [address] [place], [place] [address] [phone]  ------------------------------------------------------------------- Transthoracic Echocardiography  Patient:    [name], [name] MR #:       [id number] Study Date: 12/23/2014 Gender:     M Age:  54 Height:     188 cm Weight:     115.7 kg BSA:         2.49 m^2 Pt. Status: Room:       2H18C  ADMITTING    Franky Redia SAILOR SONOGRAPHER  Koren Daring, RDCS ATTENDING    Cheryl Algis SAILOR PERFORMING   [facility], Inpatient TISA Arm, Amber K REFERRING    Arm, [facility] K  cc:  ------------------------------------------------------------------- LV EF: 50%  ------------------------------------------------------------------- Indications:      Atrial flutter 427.32.  ------------------------------------------------------------------- History:   Risk factors:  Obese.  ------------------------------------------------------------------- Study Conclusions  - Left ventricle: Technically limited study. Inferior hypokinesis. The cavity size was mildly dilated. Wall thickness was increased in a pattern of mild LVH. The estimated ejection fraction was 50%. - Aorta: Aortic root is upper normal. - Right ventricle: The cavity size was mildly dilated. Systolic function was mildly reduced.  Transthoracic echocardiography.  M-mode, complete 2D, spectral Doppler, and color Doppler.  Birthdate:  Patient birthdate: [birth year].  Age:  Patient is 62 yr old.  Sex:  Gender: male. BMI: 32.7 kg/m^2.  Blood pressure:     142/81  Patient status: Inpatient.  Study date:  Study date: 12/23/2014. Study time: 01:31 PM.  Location:  ICU/CCU  -------------------------------------------------------------------  ------------------------------------------------------------------- Left ventricle:  Technically limited study. Inferior hypokinesis. The cavity size was mildly dilated. Wall thickness was increased in a pattern of mild LVH. The estimated ejection fraction was 50%.  ------------------------------------------------------------------- Aortic valve:   Structurally normal valve.   Cusp separation was normal.  Doppler:  Transvalvular velocity was within the normal range. There was no stenosis. There was no  regurgitation.  ------------------------------------------------------------------- Aorta:  Aortic root is upper normal.  ------------------------------------------------------------------- Mitral valve:   Structurally normal valve.   Leaflet separation was normal.  Doppler:  Transvalvular velocity was within the normal range. There was no evidence for stenosis. There was no regurgitation.  ------------------------------------------------------------------- Left atrium:  The atrium was at the upper limits of normal in size.  ------------------------------------------------------------------- Right ventricle:  The cavity size was mildly dilated. Systolic function was mildly reduced.  ------------------------------------------------------------------- Pulmonic valve:   Poorly visualized.  ------------------------------------------------------------------- Tricuspid valve:  Poorly visualized.  ------------------------------------------------------------------- Right atrium:  Poorly visualized.  ------------------------------------------------------------------- Pericardium:  There was no pericardial effusion.  ------------------------------------------------------------------- Post procedure conclusions Ascending Aorta:  - Aortic root is upper normal.  ------------------------------------------------------------------- Measurements  Left ventricle                             Value        Reference LV ID, ED, PLAX chordal          (H)       56.7  mm     43 - 52 LV ID, ES, PLAX chordal          (H)       38.6  mm     23 - 38 LV fx shortening, PLAX chordal             32    %      >=29 LV PW thickness, ED                        11.7  mm     --------- IVS/LV PW ratio, ED  1.09         <=1.3 LV ejection fraction, 1-p A4C              52    %      --------- LV end-diastolic volume, 2-p               89    ml     --------- LV end-systolic volume, 2-p                 40    ml     --------- LV ejection fraction, 2-p                  55    %      --------- Stroke volume, 2-p                         49    ml     --------- LV end-diastolic volume/bsa, 2-p           36    ml/m^2 --------- LV end-systolic volume/bsa, 2-p            16    ml/m^2 --------- Stroke volume/bsa, 2-p                     19.6  ml/m^2 --------- LV e&', lateral                             11.2  cm/s   --------- LV E/e&', lateral                           5.89         --------- LV e&', medial                              9.36  cm/s   --------- LV E/e&', medial                            7.05         --------- LV e&', average                             10.28 cm/s   --------- LV E/e&', average                           6.42         ---------  Ventricular septum                         Value        Reference IVS thickness, ED                          12.8  mm     ---------  LVOT                                       Value        Reference LVOT ID, S  26    mm     --------- LVOT area                                  5.31  cm^2   ---------  Aorta                                      Value        Reference Aortic root ID, ED                         38    mm     ---------  Left atrium                                Value        Reference LA ID, A-P, ES                             32    mm     --------- LA ID/bsa, A-P                             1.29  cm/m^2 <=2.2 LA volume, S                               74.9  ml     --------- LA volume/bsa, S                           30.1  ml/m^2 --------- LA volume, ES, 1-p A4C                     51.1  ml     --------- LA volume/bsa, ES, 1-p A4C                 20.5  ml/m^2 --------- LA volume, ES, 1-p A2C                     103   ml     --------- LA volume/bsa, ES, 1-p A2C                 41.4  ml/m^2 ---------  Mitral valve                               Value        Reference Mitral E-wave peak velocity                 66    cm/s   --------- Mitral A-wave peak velocity                38.3  cm/s   --------- Mitral deceleration time                   201   ms     150 - 230 Mitral E/A ratio, peak                     1.7          ---------  Right ventricle                            Value        Reference RV s&', lateral, S                          12.4  cm/s   ---------  Legend: (L)  and  (H)  mark values outside specified reference range.  ------------------------------------------------------------------- Prepared and Electronically Authenticated by  Reyes Forget, MD 2016-08-19T16:45:38    CT SCANS  CT CORONARY MORPH W/CTA COR W/SCORE 03/24/2023  Addendum 04/12/2023 12:23 AM ADDENDUM REPORT: 04/12/2023 00:20  EXAM: OVER-READ INTERPRETATION  CT CHEST  The following report is an over-read performed by radiologist Dr. Oneil Devonshire of [facility] on 04/12/2023. This over-read does not include interpretation of cardiac or coronary anatomy or pathology. The coronary calcium score/coronary CTA interpretation by the cardiologist is attached.  COMPARISON:  None.  FINDINGS: Cardiovascular: There are no significant extracardiac vascular findings.  Mediastinum/Nodes: There are no enlarged lymph nodes within the visualized mediastinum.  Lungs/Pleura: There is no pleural effusion. The visualized lungs appear clear.  Upper abdomen: No significant findings in the visualized upper abdomen.  Musculoskeletal/Chest wall: No chest wall mass or suspicious osseous findings within the visualized chest.  IMPRESSION: No significant extracardiac findings within the visualized chest.   Electronically Signed By: Oneil Devonshire M.D. On: 04/12/2023 00:20  Narrative CLINICAL DATA:  Chest pain  EXAM: Cardiac/Coronary CTA  TECHNIQUE: A non-contrast, gated CT scan was obtained with axial slices of 3 mm through the heart for calcium scoring. Calcium scoring was performed using the  Agatston method. A 120 kV prospective, gated, contrast cardiac scan was obtained. Gantry rotation speed was 250 msecs and collimation was 0.6 mm. Two sublingual nitroglycerin  tablets (0.8 mg) were given. The 3D data set was reconstructed in 5% intervals of the 35-75% of the R-R cycle. Diastolic phases were analyzed on a dedicated workstation using MPR, MIP, and VRT modes. The patient received 95 cc of contrast.  FINDINGS: Image quality: Excellent.  Noise artifact is: Limited.  Coronary Arteries:  Normal coronary origin.  Right dominance.  Left main: The left main is a large caliber vessel with a normal take off from the left coronary cusp that bifurcates to form a left anterior descending artery and a left circumflex artery. There is no plaque or stenosis.  Left anterior descending artery: The LAD is patent with minimal mixed density plaque in the proximal segment. The mid and distal segments are patent. The LAD gives off 2 patent diagonal branches.  Left circumflex artery: The LCX is non-dominant. There is minimal non-calcified plaque (<25%). The LCX gives off 1 patent obtuse marginal branch.  Right coronary artery: The RCA is dominant with normal take off from the right coronary cusp. There is minimal mixed density plaque (<25%). The RCA terminates as a PDA and PLV without evidence of plaque or stenosis.  Right Atrium: Right atrial size is within normal limits.  Right Ventricle: The right ventricular cavity is within normal limits.  Left Atrium: Left atrial size is normal in size with no left atrial appendage filling defect.  Left Ventricle: The ventricular cavity size is within normal limits.  Pulmonary arteries: Normal in size.  Pulmonary veins: Normal pulmonary venous drainage.  Pericardium: Normal thickness without significant effusion or calcium present.  Cardiac valves: The aortic valve is trileaflet without  significant calcification. The mitral valve is  normal without significant calcification.  Aorta: Mild dilation of the aortic root up to 41 mm (LCC).  Extra-cardiac findings: See attached radiology report for non-cardiac structures.  IMPRESSION: 1. Coronary calcium score of 39.5. This was 51st percentile for age-, sex, and race-matched controls.  2. Total plaque volume 105 mm3 which is 22nd percentile for age- and sex-matched controls (calcified plaque 19 mm3; non-calcified plaque 86 mm3; low attenuation plaque 3 mm3). TPV is moderate.  3. Normal coronary origin with right dominance.  4. Minimal mixed density plaque in the LAD/LCX/RCA (<25%).  5. Mild dilation of the aortic root up to 41 mm (LCC).  RECOMMENDATIONS: 1. CAD-RADS 1: Minimal non-obstructive CAD (0-24%). Consider non-atherosclerotic causes of chest pain. Consider preventive therapy and risk factor modification.  Darryle Decent, MD  Electronically Signed: By: Darryle Decent M.D. On: 03/24/2023 19:58          Risk Assessment/Calculations:    CHA2DS2-VASc Score = 1   This indicates a 0.6% annual risk of stroke. The patient's score is based upon: CHF History: 0 HTN History: 1 Diabetes History: 0 Stroke History: 0 Vascular Disease History: 0 Age Score: 0 Gender Score: 0          VS:  BP 118/75 (BP Location: Right Arm, Patient Position: Sitting, Cuff Size: Normal)   Pulse (!) 56   Ht 6' 2 (1.88 m)   Wt 253 lb (114.8 kg)   SpO2 97%   BMI 32.48 kg/m    Wt Readings from Last 3 Encounters:  04/17/23 253 lb (114.8 kg)  03/06/23 241 lb (109.3 kg)  05/01/22 256 lb 3.2 oz (116.2 kg)    GEN: Well nourished, well developed in no acute distress NECK: No JVD; No carotid bruits CARDIAC: RRR, no murmurs, rubs, gallops RESPIRATORY:  Clear to auscultation without rales, wheezing or rhonchi  ABDOMEN: Soft, non-tender, non-distended EXTREMITIES:  No edema; No deformity   ASSESSMENT AND PLAN: .   Atrial flutter/SVT/palpitations-s/p ablation in 2016, we  repeated monitor which revealed predominantly is in normal sinus rhythm with episodes of SVT.  He actually has not been bothered by them lately, feels there was a component of stress that was probably preceding this.  Coronary artery disease-nonobstructive per coronary CTA, we discussed that there was both mixed and calcified plaque in the implications of this.  He is stable with no anginal symptoms. No indication for ischemic evaluation.  Continue aspirin  81 mg daily.  We did discuss adding lipid-lowering agents however he wants to do more research first, he prefers a simplistic/homeopathic approach to managing his health and does not want to start any medications at this time. Heart healthy diet and regular cardiovascular exercise encouraged.    Dyslipidemia-LDL is elevated at 141 on 87/70/7976 , not on any lipid-lowering agents, and at this time is not agreeable to start any.  He does want to do some further research on both Repatha and Crestor.  We did discuss checking LPA, he would like to do some research on this as well, also to see if his insurance might pay for it.    Dispo: Coronary CTA, 2-week monitor, labs per above.  Follow-up with me in 6 weeks.  Signed, Delon JAYSON Hoover, NP  "

## 2023-04-17 ENCOUNTER — Ambulatory Visit: Payer: No Typology Code available for payment source | Attending: Cardiology | Admitting: Cardiology

## 2023-04-17 ENCOUNTER — Encounter: Payer: Self-pay | Admitting: Cardiology

## 2023-04-17 VITALS — BP 118/75 | HR 56 | Ht 74.0 in | Wt 253.0 lb

## 2023-04-17 DIAGNOSIS — I4729 Other ventricular tachycardia: Secondary | ICD-10-CM

## 2023-04-17 DIAGNOSIS — I4892 Unspecified atrial flutter: Secondary | ICD-10-CM

## 2023-04-17 DIAGNOSIS — I2510 Atherosclerotic heart disease of native coronary artery without angina pectoris: Secondary | ICD-10-CM

## 2023-04-17 NOTE — Patient Instructions (Signed)
Medication Instructions:  Your physician recommends that you continue on your current medications as directed. Please refer to the Current Medication list given to you today.  *If you need a refill on your cardiac medications before your next appointment, please call your pharmacy*   Lab Work: NONE If you have labs (blood work) drawn today and your tests are completely normal, you will receive your results only by: MyChart Message (if you have MyChart) OR A paper copy in the mail If you have any lab test that is abnormal or we need to change your treatment, we will call you to review the results.   Testing/Procedures: NONE   Follow-Up: At [facility], you and your health needs are our priority.  As part of our continuing mission to provide you with exceptional heart care, we have created designated Provider Care Teams.  These Care Teams include your primary Cardiologist (physician) and Advanced Practice Providers (APPs -  Physician Assistants and Nurse Practitioners) who all work together to provide you with the care you need, when you need it.  We recommend signing up for the patient portal called "MyChart".  Sign up information is provided on this After Visit Summary.  MyChart is used to connect with patients for Virtual Visits (Telemedicine).  Patients are able to view lab/test results, encounter notes, upcoming appointments, etc.  Non-urgent messages can be sent to your provider as well.   To learn more about what you can do with MyChart, go to [email or web address].    Your next appointment:  Call or return to clinic prn if these symptoms worsen or fail to improve as anticipated.     Provider:   Lewayne Bunting, MD   or Wallis Bamberg  Other Instructions

## 2023-05-06 ENCOUNTER — Telehealth: Payer: Self-pay | Admitting: Emergency Medicine

## 2023-05-06 NOTE — Telephone Encounter (Signed)
-----   Message from Delon JAYSON Hoover sent at 05/06/2023  1:33 PM EST ----- Your monitor revealed her average heart rate was 82 bpm, you did have episodes where your heart speeds up however this occurs from the part of your heart that is not worrisome, and does not necessarily need to be treated unless you are very bothered by it.  Overall, reassuring result.

## 2023-05-06 NOTE — Telephone Encounter (Signed)
 Results reviewed with pt as per Wallis Bamberg NP's note.  Pt verbalized understanding and had no additional questions. Routed to PCP.

## 2024-03-15 ENCOUNTER — Ambulatory Visit: Attending: Internal Medicine | Admitting: Internal Medicine

## 2024-03-15 ENCOUNTER — Encounter: Payer: Self-pay | Admitting: Internal Medicine

## 2024-03-15 VITALS — BP 126/84 | HR 69 | Ht 74.0 in | Wt 251.1 lb

## 2024-03-15 DIAGNOSIS — I471 Supraventricular tachycardia, unspecified: Secondary | ICD-10-CM

## 2024-03-15 NOTE — Patient Instructions (Signed)

## 2024-03-15 NOTE — Progress Notes (Signed)
 HPI [name] returns today for followup. He is a pleasant 64 yo man with a h/o SVT and atrial flutter with both ablated in 2017. He is still bothered by muscle cramps and has rare palpitations. He does not consume much caffeine but admits to drinking a couple of drinks a couple of times a week. No chest pain or sob. He has decided not to pursue sleep eval.   No Known Allergies   Current Outpatient Medications  Medication Sig Dispense Refill   acetaminophen  (TYLENOL ) 500 MG tablet Take 500 mg by mouth every 6 (six) hours as needed.      aspirin  EC 81 MG tablet Take 1 tablet (81 mg total) by mouth daily. 90 tablet 3   Oral Electrolytes (LIQUID I.V. PO) Take 1-2 packets by mouth daily.     No current facility-administered medications for this visit.     Past Medical History:  Diagnosis Date   GERD (gastroesophageal reflux disease)    Obesity    Paroxysmal atrial flutter (HCC)    PSVT (paroxysmal supraventricular tachycardia)     ROS:   All systems reviewed and negative except as noted in the HPI.   Past Surgical History:  Procedure Laterality Date   COLONOSCOPY  03/21/2020   ELECTROPHYSIOLOGIC STUDY N/A 05/02/2015   Procedure: A-Flutter Ablation;  Surgeon: Steven LELON [place], MD;  Location: [facility] INVASIVE CV LAB;  Service: Cardiovascular;  Laterality: N/A;   right toe surgery  2012   UPPER GASTROINTESTINAL ENDOSCOPY  03/21/2020     Family History  Problem Relation Age of Onset   Aneurysm Mother 77       brain   Congestive Heart Failure Father    Diabetes Father    Other Cousin        abdominal pain, dead colon   Colon cancer Neg Hx    Colon polyps Neg Hx    Esophageal cancer Neg Hx    Rectal cancer Neg Hx    Stomach cancer Neg Hx      Social History   Socioeconomic History   Marital status: Married    Spouse name: Not on file   Number of children: 1   Years of education: Not on file   Highest education level: Not on file  Occupational History    Occupation: lawn care    Employer: [employer]  Tobacco Use   Smoking status: Never   Smokeless tobacco: Never  Vaping Use   Vaping status: Never Used  Substance and Sexual Activity   Alcohol use: Yes    Alcohol/week: 7.0 standard drinks of alcohol    Types: 2 Cans of beer, 1 Shots of liquor, 4 Glasses of wine per week    Comment: occasionally   Drug use: No   Sexual activity: Not on file  Other Topics Concern   Not on file  Social History Narrative   Not on file   Social Drivers of Health   Financial Resource Strain: Not on file  Food Insecurity: Not on file  Transportation Needs: Not on file  Physical Activity: Not on file  Stress: Not on file  Social Connections: Not on file  Intimate Partner Violence: Not on file     BP 126/84   Pulse 69   Ht 6' 2 (1.88 m)   Wt 251 lb 1.6 oz (113.9 kg)   SpO2 98%   BMI 32.24 kg/m   Physical Exam:  Well appearing NAD HEENT: Unremarkable Neck:  No JVD, no thyromegally Lymphatics:  No adenopathy Back:  No CVA tenderness Lungs:  Clear with no wheezes HEART:  Regular rate rhythm, no murmurs, no rubs, no clicks Abd:  soft, positive bowel sounds, no organomegally, no rebound, no guarding Ext:  2 plus pulses, no edema, no cyanosis, no clubbing Skin:  No rashes no nodules Neuro:  CN II through XII intact, motor grossly intact  EKG - nsr   Assess/Plan:  Palpitations - he is improved and will undergo watchful waiting.  Cramps - I encouraged either gatorade or diluted juice and hydration and use caffeine. Snoring - I am concerned about sleep apnea though he thinks he is sleeping better.  Steven Cortlyn Cannell,MD

## 2024-05-18 ENCOUNTER — Ambulatory Visit: Payer: Self-pay

## 2024-05-18 NOTE — Telephone Encounter (Signed)
FYI. Pt going to ED.  

## 2024-05-18 NOTE — Telephone Encounter (Signed)
 FYI Only or Action Required?: Action required by provider: clinical question for provider and update on patient condition.  Patient was last seen in primary care on 05/01/2022 by Dorina Loving, PA-C.  Called Nurse Triage reporting Diarrhea.  Symptoms began yesterday.  Interventions attempted: Rest, hydration, or home remedies.  Symptoms are: gradually improving.  Triage Disposition: See PCP When Office is Open (Within 3 Days)  Patient/caregiver understands and will follow disposition?: Yes    Copied from CRM #[id number]. Topic: Clinical - Red Word Triage >> May 18, 2024  9:11 AM Berneda FALCON wrote: Red Word that prompted transfer to Nurse Triage: Patient has vomiting, diarrhea, chills since last night.   Reason for Disposition  [1] MILD diarrhea (e.g., 1-3 or more stools than normal in past 24 hours) AND [2] present >  7 days  (Exception: Chronic diarrhea that is not worse.)  Answer Assessment - Initial Assessment Questions Pt contacted clinic to report diarrhea x 24 hours and emesis x 1. Pt states his wife has had n/v/d x 3 days with worsening symptoms. Pt states he does not have fever, no s/s of dehydration. Pt is eating and drinking, taking in electrolytes. He states stools were loose but starting to resolve at this time. Offered appt but pt is taking wife to ED, will f/u with clinic if needed.     1. DIARRHEA SEVERITY: How bad is the diarrhea? How many more stools have you had in the past 24 hours than normal?      Yes; symptoms starting to ease at this time   2. ONSET: When did the diarrhea begin?      Yesterday   3. STOOL DESCRIPTION:  How loose or watery is the diarrhea? What is the stool color? Is there any blood or mucous in the stool?     Loose   4. VOMITING: Are you also vomiting? If Yes, ask: How many times in the past 24 hours?      X 1, yesterday   5. ABDOMEN PAIN: Are you having any abdomen pain? If Yes, ask: What does it feel like? (e.g.,  crampy, dull, intermittent, constant)      No   6. ABDOMEN PAIN SEVERITY: If present, ask: How bad is the pain?  (e.g., Scale 1-10; mild, moderate, or severe)     N/a  7. ORAL INTAKE: If vomiting, Have you been able to drink liquids? How much liquids have you had in the past 24 hours?     Pt is drinking electrolytes, powerade and able to eat   8. HYDRATION: Any signs of dehydration? (e.g., dry mouth [not just dry lips], too weak to stand, dizziness, new weight loss) When did you last urinate?     No  Protocols used: Diarrhea-A-AH
